# Patient Record
Sex: Female | Born: 1960 | Race: White | Hispanic: No | Marital: Married | Smoking: Never smoker
Health system: Southern US, Community
[De-identification: ages and names within clinical notes are randomized; demographics above are authoritative.]

## PROBLEM LIST (undated history)

## (undated) DIAGNOSIS — Z8619 Personal history of other infectious and parasitic diseases: Secondary | ICD-10-CM

## (undated) DIAGNOSIS — J309 Allergic rhinitis, unspecified: Secondary | ICD-10-CM

## (undated) DIAGNOSIS — E039 Hypothyroidism, unspecified: Secondary | ICD-10-CM

## (undated) DIAGNOSIS — K219 Gastro-esophageal reflux disease without esophagitis: Secondary | ICD-10-CM

## (undated) DIAGNOSIS — E079 Disorder of thyroid, unspecified: Secondary | ICD-10-CM

## (undated) DIAGNOSIS — S93409A Sprain of unspecified ligament of unspecified ankle, initial encounter: Secondary | ICD-10-CM

## (undated) DIAGNOSIS — M1990 Unspecified osteoarthritis, unspecified site: Secondary | ICD-10-CM

## (undated) DIAGNOSIS — Z9289 Personal history of other medical treatment: Secondary | ICD-10-CM

## (undated) HISTORY — PX: KNEE ARTHROSCOPY: SUR90

## (undated) HISTORY — DX: Personal history of other infectious and parasitic diseases: Z86.19

## (undated) HISTORY — DX: Personal history of other medical treatment: Z92.89

## (undated) HISTORY — PX: OOPHORECTOMY: SHX86

## (undated) HISTORY — DX: Sprain of unspecified ligament of unspecified ankle, initial encounter: S93.409A

## (undated) HISTORY — DX: Allergic rhinitis, unspecified: J30.9

## (undated) HISTORY — DX: Disorder of thyroid, unspecified: E07.9

---

## 1992-07-17 HISTORY — PX: DILATION AND CURETTAGE OF UTERUS: SHX78

## 1994-05-20 HISTORY — PX: LASIK: SHX215

## 1997-06-10 ENCOUNTER — Ambulatory Visit (HOSPITAL_COMMUNITY): Admission: RE | Admit: 1997-06-10 | Discharge: 1997-06-10 | Payer: Self-pay

## 2001-03-07 HISTORY — PX: ABDOMINAL HYSTERECTOMY: SHX81

## 2005-12-13 ENCOUNTER — Emergency Department: Payer: Self-pay | Admitting: Emergency Medicine

## 2005-12-13 ENCOUNTER — Other Ambulatory Visit: Payer: Self-pay

## 2010-08-04 DIAGNOSIS — E039 Hypothyroidism, unspecified: Secondary | ICD-10-CM | POA: Insufficient documentation

## 2010-08-20 LAB — HM MAMMOGRAPHY

## 2013-10-28 LAB — BASIC METABOLIC PANEL
BUN: 14 mg/dL (ref 4–21)
CREATININE: 1 mg/dL (ref 0.5–1.1)
Glucose: 90 mg/dL
POTASSIUM: 4.2 mmol/L (ref 3.4–5.3)
Sodium: 139 mmol/L (ref 137–147)

## 2013-10-28 LAB — CBC AND DIFFERENTIAL
HCT: 42 % (ref 36–46)
Hemoglobin: 15.1 g/dL (ref 12.0–16.0)
PLATELETS: 224 10*3/uL (ref 150–399)
WBC: 5.7 10^3/mL

## 2013-10-28 LAB — HEPATIC FUNCTION PANEL
ALT: 18 U/L (ref 7–35)
AST: 26 U/L (ref 13–35)

## 2014-04-28 LAB — TSH: TSH: 0.66 u[IU]/mL (ref <=5.90)

## 2014-08-29 ENCOUNTER — Encounter: Payer: Self-pay | Admitting: Family Medicine

## 2014-08-29 LAB — HM COLONOSCOPY

## 2014-10-30 ENCOUNTER — Other Ambulatory Visit: Payer: Self-pay | Admitting: Family Medicine

## 2014-12-23 ENCOUNTER — Other Ambulatory Visit: Payer: Self-pay | Admitting: Family Medicine

## 2015-01-07 DIAGNOSIS — L219 Seborrheic dermatitis, unspecified: Secondary | ICD-10-CM | POA: Insufficient documentation

## 2015-01-07 DIAGNOSIS — R232 Flushing: Secondary | ICD-10-CM | POA: Insufficient documentation

## 2015-01-07 DIAGNOSIS — R609 Edema, unspecified: Secondary | ICD-10-CM | POA: Insufficient documentation

## 2015-01-07 DIAGNOSIS — M7740 Metatarsalgia, unspecified foot: Secondary | ICD-10-CM | POA: Insufficient documentation

## 2015-01-07 DIAGNOSIS — M2140 Flat foot [pes planus] (acquired), unspecified foot: Secondary | ICD-10-CM | POA: Insufficient documentation

## 2015-01-07 DIAGNOSIS — R591 Generalized enlarged lymph nodes: Secondary | ICD-10-CM | POA: Insufficient documentation

## 2015-01-08 ENCOUNTER — Ambulatory Visit (INDEPENDENT_AMBULATORY_CARE_PROVIDER_SITE_OTHER): Payer: BLUE CROSS/BLUE SHIELD | Admitting: Family Medicine

## 2015-01-08 ENCOUNTER — Encounter: Payer: Self-pay | Admitting: Family Medicine

## 2015-01-08 VITALS — BP 92/64 | HR 68 | Temp 98.7°F | Resp 16 | Ht 63.16 in | Wt 145.0 lb

## 2015-01-08 DIAGNOSIS — Z1159 Encounter for screening for other viral diseases: Secondary | ICD-10-CM

## 2015-01-08 DIAGNOSIS — E039 Hypothyroidism, unspecified: Secondary | ICD-10-CM | POA: Diagnosis not present

## 2015-01-08 DIAGNOSIS — Z0000 Encounter for general adult medical examination without abnormal findings: Secondary | ICD-10-CM

## 2015-01-08 DIAGNOSIS — J309 Allergic rhinitis, unspecified: Secondary | ICD-10-CM | POA: Diagnosis not present

## 2015-01-08 NOTE — Progress Notes (Signed)
Patient: Lisa Skinner, Female    DOB: Nov 04, 1960, 54 y.o.   MRN: [id number] Visit Date: 01/08/2015  Today's Provider: Lelon Huh, MD   Chief Complaint  Patient presents with  . Annual Exam  . Hypothyroidism   Subjective:    Annual physical exam Lisa Skinner is a 54 y.o. female who presents today for health maintenance and complete physical. She feels well. She reports exercising yes/walking. She reports she is sleeping well/7 hours.  -----------------------------------------------------------------   Follow-up for hypothyroidism from 04/28/2014; Lab Results  Component Value Date   TSH 0.66 04/28/2014   She states she been more irritable lately with some hot flashes similar to what she had when she was initially diagnosed with hypothyroidism. She recently had yearly check up with Ammie Dalton and states she was told she should have thyroid functions checked.    Allergies She states combination of Flonase and fexofenadine remains fairly effective.   Review of Systems  Constitutional:       Irritability and hot flashes   HENT: Positive for congestion, postnasal drip, rhinorrhea, sinus pressure and sneezing.   Eyes: Positive for itching.  Respiratory: Positive for cough and wheezing.   Cardiovascular: Negative.   Gastrointestinal: Positive for abdominal distention.  Endocrine: Positive for cold intolerance, polydipsia and polyuria.  Genitourinary: Positive for urgency and frequency.  Musculoskeletal: Positive for myalgias, back pain, arthralgias and neck stiffness.  Skin: Negative.   Allergic/Immunologic: Positive for environmental allergies and food allergies.  Neurological: Positive for light-headedness.  Hematological: Negative.   Psychiatric/Behavioral: Positive for agitation. The patient is nervous/anxious.     Social History She  reports that she has never smoked. She does not have any smokeless tobacco history on file. She reports that she does not drink  alcohol or use illicit drugs. Social History   Social History  . Marital Status: Married    Spouse Name: N/A  . Number of Children: 0  . Years of Education: N/A   Occupational History  . 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 Smoking status: Never Smoker   . Smokeless tobacco: None  . Alcohol Use: No  . Drug Use: No  . Sexual Activity: Not Asked   Other Topics Concern  . None   Social History Narrative    Patient Active Problem List   Diagnosis Date Noted  . Seborrheic dermatitis 01/07/2015  . Edema 01/07/2015  . Hot flashes 01/07/2015  . Pes planus 01/07/2015  . Hypothyroidism 08/04/2010  . Allergic rhinitis 06/23/2008  . Displacement of lumbar intervertebral disc without myelopathy 02/17/2006  . H/O total hysterectomy 03/07/2001    Past Surgical History  Procedure Laterality Date  . Abdominal hysterectomy  2003    with BSO multiple cyst    Family History  Family Status  Relation Status Death Age  . Mother Deceased 63  . Father Alive     Borderline Diabetes   Her family history includes Alcohol abuse in her father; Cancer in her mother.    Allergies  Allergen Reactions  . Augmentin  [Amoxicillin-Pot Clavulanate]     (GI upset)  . Codeine     Other reaction(s): Abdominal pain    Previous Medications   ESTRADIOL (VIVELLE-DOT) 0.05 MG/24HR PATCH    Place 1 patch onto the skin 2 (two) times a week.   FEXOFENADINE (ALLEGRA) 180 MG TABLET    Take 1 tablet by mouth daily.   FLUTICASONE (FLONASE) 50 MCG/ACT  NASAL SPRAY    instill 2 sprays into each nostril once daily   LEVOTHYROXINE (SYNTHROID, LEVOTHROID) 88 MCG TABLET    take 1 tablet by mouth every morning ON AN EMPTY STOMACH   MULTIPLE VITAMINS-MINERALS (MULTIVITAMIN ADULTS 50+) TABS    Take 1 tablet by mouth daily.   OMEGA-3 FATTY ACIDS (FISH OIL) 1360 MG CAPS    Take 1 capsule by mouth daily.   PROBIOTIC CAPS    Take 1 capsule by mouth daily.    Patient Care Team: Birdie Sons, MD as PCP - General (Family Medicine) Eusebio Me, MD (Unknown Physician Specialty)     Objective:   Vitals: BP 92/64 mmHg  Pulse 68  Temp(Src) 98.7 F (37.1 C) (Oral)  Resp 16  Ht 5' 3.16" (1.604 m)  Wt 145 lb (65.772 kg)  BMI 25.56 kg/m2  SpO2 100%   Physical Exam   General Appearance:    Alert, cooperative, no distress, appears stated age  Head:    Normocephalic, without obvious abnormality, atraumatic  Eyes:    PERRL, conjunctiva/corneas clear, EOM's intact, fundi    benign, both eyes  Ears:    Normal TM's and external ear canals, both ears  Nose:   Nares normal, septum midline, mucosa normal, no drainage    or sinus tenderness  Throat:   Lips, mucosa, and tongue normal; teeth and gums normal  Neck:   Supple, symmetrical, trachea midline, no adenopathy;    thyroid:  no enlargement/tenderness/nodules; no carotid   bruit or JVD  Back:     Symmetric, no curvature, ROM normal, no CVA tenderness  Lungs:     Clear to auscultation bilaterally, respirations unlabored  Chest Wall:    No tenderness or deformity   Heart:    Regular rate and rhythm, S1 and S2 normal, no murmur, rub   or gallop  Breast Exam:    deferred  Abdomen:     Soft, non-tender, bowel sounds active all four quadrants,    no masses, no organomegaly  Pelvic:    deferred  Extremities:   Extremities normal, atraumatic, no cyanosis or edema  Pulses:   2+ and symmetric all extremities  Skin:   Skin color, texture, turgor normal, no rashes or lesions  Lymph nodes:   Cervical, supraclavicular, and axillary nodes normal  Neurologic:   CNII-XII intact, normal strength, sensation and reflexes    throughout     Depression Screen PHQ 2/9 Scores 01/08/2015  PHQ - 2 Score 0  PHQ- 9 Score 0      Assessment & Plan:     Routine Health Maintenance and Physical Exam  Exercise Activities and Dietary recommendations Goals    None      Immunization History  Administered Date(s) Administered  .  Influenza-Unspecified 12/09/2014  . Tdap 05/15/2012    Health Maintenance  Topic Date Due  . Hepatitis C Screening  12-Jan-1961  . MAMMOGRAM  Ammie Dalton  . INFLUENZA VACCINE  Received at work  . TETANUS/TDAP  05/16/2022  . COLONOSCOPY  08/28/2024      Discussed health benefits of physical activity, and encouraged her to engage in regular exercise appropriate for her age and condition.    --------------------------------------------------------------------  1. Annual physical exam  - Lipid panel - Comprehensive metabolic panel  2. Hypothyroidism, unspecified hypothyroidism type  - T4, free - TSH  3. Allergic rhinitis, unspecified allergic rhinitis type Doing well on fexofenadine and fluticasone nasal spray  4. Need for hepatitis  C screening test  - Hepatitis C antibody

## 2015-01-09 ENCOUNTER — Telehealth: Payer: Self-pay | Admitting: Family Medicine

## 2015-01-09 LAB — COMPREHENSIVE METABOLIC PANEL
A/G RATIO: 1.6 (ref 1.1–2.5)
ALBUMIN: 4.2 g/dL (ref 3.5–5.5)
ALT: 21 IU/L (ref 0–32)
AST: 29 IU/L (ref 0–40)
Alkaline Phosphatase: 51 IU/L (ref 39–117)
BUN/Creatinine Ratio: 13 (ref 9–23)
BUN: 12 mg/dL (ref 6–24)
Bilirubin Total: 1 mg/dL (ref 0.0–1.2)
CALCIUM: 9.4 mg/dL (ref 8.7–10.2)
CO2: 26 mmol/L (ref 18–29)
Chloride: 100 mmol/L (ref 97–106)
Creatinine, Ser: 0.94 mg/dL (ref 0.57–1.00)
GFR, EST AFRICAN AMERICAN: 80 mL/min/{1.73_m2} (ref >59)
GFR, EST NON AFRICAN AMERICAN: 69 mL/min/{1.73_m2} (ref >59)
GLOBULIN, TOTAL: 2.7 g/dL (ref 1.5–4.5)
Glucose: 92 mg/dL (ref 65–99)
POTASSIUM: 4.8 mmol/L (ref 3.5–5.2)
Sodium: 140 mmol/L (ref 136–144)
TOTAL PROTEIN: 6.9 g/dL (ref 6.0–8.5)

## 2015-01-09 LAB — LIPID PANEL
CHOLESTEROL TOTAL: 199 mg/dL (ref 100–199)
Chol/HDL Ratio: 2.1 ratio units (ref 0.0–4.4)
HDL: 94 mg/dL (ref >39)
LDL Calculated: 93 mg/dL (ref 0–99)
TRIGLYCERIDES: 62 mg/dL (ref 0–149)
VLDL Cholesterol Cal: 12 mg/dL (ref 5–40)

## 2015-01-09 LAB — TSH: TSH: 0.897 u[IU]/mL (ref 0.450–4.500)

## 2015-01-09 LAB — HEPATITIS C ANTIBODY

## 2015-01-09 LAB — T4, FREE: Free T4: 1.73 ng/dL (ref 0.82–1.77)

## 2015-01-09 MED ORDER — LEVOTHYROXINE SODIUM 75 MCG PO TABS: 75.0000 ug | ORAL_TABLET | Freq: Every day | ORAL | Status: DC

## 2015-01-09 NOTE — Telephone Encounter (Signed)
LMOVM to return call.

## 2015-01-09 NOTE — Telephone Encounter (Signed)
-----   Message from Birdie Sons, MD sent at 01/09/2015  7:58 AM EDT ----- Cholesterol is very good at 199. Blood sugar, electrolytes, and kidney functions are normal. Thyroid hormone is at the high end of the normal range. Too much thyroid hormone might cause hot flashes. If she likes we can try reducing levothyroxine to 69mcg to see if this helps.

## 2015-01-09 NOTE — Telephone Encounter (Signed)
rx was send to [facility]. Have patient call at the end of the month and let me know if she feels better with the 75 or th 52mcg dose.

## 2015-01-09 NOTE — Telephone Encounter (Addendum)
Patient notified of results. Patient expressed understanding. Rx pending for quantity.

## 2015-01-09 NOTE — Telephone Encounter (Signed)
Pt returning call.  CB#[phone]/MW

## 2015-01-12 NOTE — Telephone Encounter (Signed)
Patient notified. Patient stated that she can already tell a difference since starting the 75 mcg. Patient said that she has not been having the hot flashes. Patient will call in a month and let us know how 75 mcg is working.

## 2015-02-05 ENCOUNTER — Telehealth: Payer: Self-pay | Admitting: Family Medicine

## 2015-02-05 NOTE — Telephone Encounter (Signed)
Pt wanted to up date the nurses on how she has been doing on taking the lower dose of levothyroxine (SYNTHROID, LEVOTHROID) 75 MCG tablet. Pt stated so far she has been doing well with the dosage change. Thanks TNP

## 2015-03-05 ENCOUNTER — Other Ambulatory Visit: Payer: Self-pay | Admitting: Family Medicine

## 2015-05-29 ENCOUNTER — Ambulatory Visit: Payer: BLUE CROSS/BLUE SHIELD | Admitting: Family Medicine

## 2015-05-29 ENCOUNTER — Encounter: Payer: Self-pay | Admitting: Family Medicine

## 2015-05-29 ENCOUNTER — Ambulatory Visit (INDEPENDENT_AMBULATORY_CARE_PROVIDER_SITE_OTHER): Payer: BC Managed Care – PPO | Admitting: Family Medicine

## 2015-05-29 VITALS — BP 104/60 | HR 67 | Temp 98.1°F | Resp 16 | Ht 63.16 in | Wt 143.0 lb

## 2015-05-29 DIAGNOSIS — R52 Pain, unspecified: Secondary | ICD-10-CM | POA: Diagnosis not present

## 2015-05-29 DIAGNOSIS — E039 Hypothyroidism, unspecified: Secondary | ICD-10-CM | POA: Diagnosis not present

## 2015-05-29 DIAGNOSIS — J069 Acute upper respiratory infection, unspecified: Secondary | ICD-10-CM | POA: Diagnosis not present

## 2015-05-29 LAB — POCT INFLUENZA A/B
Influenza A, POC: NEGATIVE
Influenza B, POC: NEGATIVE

## 2015-05-29 MED ORDER — LEVOTHYROXINE SODIUM 88 MCG PO TABS: 88.0000 ug | ORAL_TABLET | Freq: Every day | ORAL | Status: DC

## 2015-05-29 MED ORDER — AMOXICILLIN 500 MG PO CAPS: 1000.0000 mg | ORAL_CAPSULE | Freq: Two times a day (BID) | ORAL | Status: AC

## 2015-05-29 NOTE — Progress Notes (Signed)
Patient: Lisa Skinner Female    DOB: April 23, 1960   55 y.o.   MRN: [id number] Visit Date: 05/29/2015  Today's Provider: Lelon Huh, MD   Chief Complaint  Patient presents with  . Cough   Subjective:    Cough This is a new problem. Episode onset: 2 days. The problem has been gradually worsening. The cough is non-productive. Associated symptoms include chest pain, a fever, headaches, nasal congestion, postnasal drip, rhinorrhea and a sore throat. Pertinent negatives include no chills, ear congestion, ear pain, rash, shortness of breath or sweats. Associated symptoms comments: Itchy ears. The symptoms are aggravated by fumes, exercise and cold air. Treatments tried: sudafed, tylenol  The treatment provided mild relief. Her past medical history is significant for environmental allergies.    Sinus congestion started 2 days ago. Nasal drainage, facial pressure, non-productive cough, scratchy throat and shoulder pain. Took OTC pain reliever last night.   Hypothyroid Since last visit we reduced levothyroxine, but she states after a few months she felt so tired she had to go back up to previous dose of 54mcg and felt energy level was much better. She has not had labs checked since then Lab Results  Component Value Date   TSH 0.897 01/08/2015     Allergies  Allergen Reactions  . Augmentin  [Amoxicillin-Pot Clavulanate]     (GI upset)  . Codeine     Other reaction(s): Abdominal pain   Previous Medications   ESTRADIOL (VIVELLE-DOT) 0.05 MG/24HR PATCH    Place 1 patch onto the skin 2 (two) times a week.   FEXOFENADINE (ALLEGRA) 180 MG TABLET    Take 1 tablet by mouth daily.   FLUTICASONE (FLONASE) 50 MCG/ACT NASAL SPRAY    instill 2 sprays into each nostril once daily   LEVOTHYROXINE (SYNTHROID, LEVOTHROID) 88 MCG TABLET    take 1 tablet by mouth once daily before BREAKFAST   MULTIPLE VITAMINS-MINERALS (MULTIVITAMIN ADULTS 50+) TABS    Take 1 tablet by mouth daily.   OMEGA-3 FATTY  ACIDS (FISH OIL) 1360 MG CAPS    Take 1 capsule by mouth daily.   PROBIOTIC CAPS    Take 1 capsule by mouth daily.    Review of Systems  Constitutional: Positive for fever. Negative for chills, appetite change and fatigue.  HENT: Positive for congestion, postnasal drip, rhinorrhea, sinus pressure and sore throat. Negative for ear pain.   Respiratory: Positive for cough. Negative for chest tightness and shortness of breath.   Cardiovascular: Positive for chest pain. Negative for palpitations.  Gastrointestinal: Negative for nausea, vomiting and abdominal pain.  Musculoskeletal:       Shoulder pain  Skin: Negative for rash.  Allergic/Immunologic: Positive for environmental allergies.  Neurological: Positive for headaches. Negative for dizziness and weakness.    Social History  Substance Use Topics  . Smoking status: Never Smoker   . Smokeless tobacco: Not on file  . Alcohol Use: No   Objective:   BP 104/60 mmHg  Pulse 67  Temp(Src) 98.1 F (36.7 C) (Oral)  Resp 16  Ht 5' 3.16" (1.604 m)  Wt 143 lb (64.864 kg)  BMI 25.21 kg/m2  SpO2 97%  Physical Exam  General Appearance:    Alert, cooperative, no distress  HENT:   bilateral TM normal without fluid or infection, neck without nodes, neck has bilateral anterior cervical nodes enlarged, throat normal without erythema or exudate, frontal and maxillary sinus tender, nasal mucosa congested and nasal mucosa pale and congested  Eyes:    PERRL, conjunctiva/corneas clear, EOM's intact       Lungs:     Clear to auscultation bilaterally, respirations unlabored  Heart:    Regular rate and rhythm  Neurologic:   Awake, alert, oriented x 3. No apparent focal neurological           defect.       Results for orders placed or performed in visit on 05/29/15  POCT Influenza A/B  Result Value Ref Range   Influenza A, POC Negative Negative   Influenza B, POC Negative Negative      Assessment & Plan:     1. Upper respiratory  infection Counseled regarding signs and symptoms of viral and bacterial respiratory infections. Advised to call or return for additional evaluation if he develops any sign of bacterial infection, or if current symptoms last longer than 10 days.    - amoxicillin (AMOXIL) 500 MG capsule; Take 2 capsules (1,000 mg total) by mouth 2 (two) times daily.  Dispense: 40 capsule; Refill: 0  2. Body aches  - POCT Influenza A/B  3. Hypothyroidism, unspecified hypothyroidism type Feels better since going back up to 69mcg levothyroxine.  - T4 AND TSH       Lelon Huh, MD  [facility]

## 2015-06-02 ENCOUNTER — Telehealth: Payer: Self-pay

## 2015-06-02 ENCOUNTER — Telehealth: Payer: Self-pay | Admitting: Family Medicine

## 2015-06-02 LAB — T4 AND TSH
T4, Total: 11.2 ug/dL (ref 4.5–12.0)
TSH: 1.01 u[IU]/mL (ref 0.450–4.500)

## 2015-06-02 MED ORDER — LEVOTHYROXINE SODIUM 88 MCG PO TABS: 88.0000 ug | ORAL_TABLET | Freq: Every day | ORAL | Status: DC

## 2015-06-02 NOTE — Telephone Encounter (Signed)
Informed pt. Lisa Skinner, CMA  

## 2015-06-02 NOTE — Telephone Encounter (Signed)
Have sent refill for 77mcg tablets to [facility]

## 2015-06-02 NOTE — Telephone Encounter (Signed)
Advised pt of lab results. Pt is questioning if she needs to be on Levothyroxine 88 mcg vs 75 mcg. Is feeling well on the 88 mcg. If you want pt to stay on this dose, she needs a new rx to be sent to [facility]. Thanks. Renaldo Fiddler, CMA

## 2015-08-13 ENCOUNTER — Other Ambulatory Visit: Payer: Self-pay | Admitting: Family Medicine

## 2015-08-13 DIAGNOSIS — M5126 Other intervertebral disc displacement, lumbar region: Secondary | ICD-10-CM

## 2015-08-14 LAB — HM PAP SMEAR: HM PAP: NEGATIVE

## 2015-09-15 DIAGNOSIS — Z9289 Personal history of other medical treatment: Secondary | ICD-10-CM

## 2015-09-15 HISTORY — DX: Personal history of other medical treatment: Z92.89

## 2015-09-15 LAB — HM MAMMOGRAPHY

## 2015-10-14 NOTE — Telephone Encounter (Signed)
error 

## 2015-11-11 ENCOUNTER — Ambulatory Visit (INDEPENDENT_AMBULATORY_CARE_PROVIDER_SITE_OTHER): Payer: BC Managed Care – PPO | Admitting: Family Medicine

## 2015-11-11 ENCOUNTER — Encounter: Payer: Self-pay | Admitting: Family Medicine

## 2015-11-11 VITALS — BP 108/70 | HR 64 | Temp 98.1°F | Resp 16 | Wt 143.0 lb

## 2015-11-11 DIAGNOSIS — J069 Acute upper respiratory infection, unspecified: Secondary | ICD-10-CM

## 2015-11-11 MED ORDER — AMOXICILLIN 500 MG PO CAPS: 1000.0000 mg | ORAL_CAPSULE | Freq: Two times a day (BID) | ORAL | 0 refills | Status: AC

## 2015-11-11 NOTE — Progress Notes (Signed)
Patient: Lisa Skinner Female    DOB: 01/11/1961   55 y.o.   MRN: [id number] Visit Date: 11/11/2015  Today's Provider: Lelon Huh, MD   Chief Complaint  Patient presents with  . Ear Pain   Subjective:    HPI Ear Pain: Patient comes in reporting she has had pain in her right ear for 3 days. Patient also has soreness on the right side of her neck and throat. Patient has tried taking Tylenol to help relieve pain. Ear has worsened since onset. Ear pain is described as a throbbing sensation.     Allergies  Allergen Reactions  . Augmentin  [Amoxicillin-Pot Clavulanate]     (GI upset)  . Codeine     Other reaction(s): Abdominal pain     Current Outpatient Prescriptions:  .  estradiol (VIVELLE-DOT) 0.05 MG/24HR patch, Place 1 patch onto the skin 2 (two) times a week., Disp: , Rfl:  .  etodolac (LODINE) 400 MG tablet, take 1 tablet by mouth twice a day if needed, Disp: 30 tablet, Rfl: 5 .  fexofenadine (ALLEGRA) 180 MG tablet, Take 1 tablet by mouth daily., Disp: , Rfl:  .  fluticasone (FLONASE) 50 MCG/ACT nasal spray, instill 2 sprays into each nostril once daily, Disp: 16 g, Rfl: 6 .  levothyroxine (SYNTHROID, LEVOTHROID) 88 MCG tablet, Take 1 tablet (88 mcg total) by mouth daily., Disp: 90 tablet, Rfl: 3 .  Multiple Vitamins-Minerals (MULTIVITAMIN ADULTS 50+) TABS, Take 1 tablet by mouth daily., Disp: , Rfl:  .  Omega-3 Fatty Acids (FISH OIL) 1360 MG CAPS, Take 1 capsule by mouth daily., Disp: , Rfl:  .  Probiotic CAPS, Take 1 capsule by mouth daily., Disp: , Rfl:   Review of Systems  Constitutional: Negative for appetite change, chills, fatigue and fever.  HENT: Positive for congestion, ear pain and sore throat.   Respiratory: Positive for cough. Negative for chest tightness and shortness of breath.   Cardiovascular: Negative for chest pain and palpitations.  Gastrointestinal: Negative for abdominal pain, nausea and vomiting.  Musculoskeletal: Positive for neck pain.    Neurological: Negative for dizziness and weakness.    Social History  Substance Use Topics  . Smoking status: Never Smoker  . Smokeless tobacco: Never Used  . Alcohol use No   Objective:   BP 108/70 (BP Location: Right Arm, Patient Position: Sitting, Cuff Size: Normal)   Pulse 64   Temp 98.1 F (36.7 C) (Oral)   Resp 16   Wt 143 lb (64.9 kg)   BMI 25.20 kg/m   Physical Exam  General Appearance:    Alert, cooperative, no distress  HENT:   bilateral TM normal without fluid or infection, neck has right anterior cervical nodes enlarged, pharynx erythematous without exudate, sinuses nontender, post nasal drip noted and nasal mucosa pale and congested  Eyes:    PERRL, conjunctiva/corneas clear, EOM's intact       Lungs:     Clear to auscultation bilaterally, respirations unlabored  Heart:    Regular rate and rhythm  Neurologic:   Awake, alert, oriented x 3. No apparent focal neurological           defect.           Assessment & Plan:     1. Upper respiratory infection Counseled regarding signs and symptoms of viral and bacterial respiratory infections. Advised to fill antibiotic prescription if she develops any sign of bacterial infection, or if current symptoms last longer than  10 days.    - amoxicillin (AMOXIL) 500 MG capsule; Take 2 capsules (1,000 mg total) by mouth 2 (two) times daily.  Dispense: 40 capsule; Refill: 0       Lelon Huh, MD  [facility]

## 2016-02-26 ENCOUNTER — Encounter: Payer: Self-pay | Admitting: Family Medicine

## 2016-02-26 ENCOUNTER — Ambulatory Visit (INDEPENDENT_AMBULATORY_CARE_PROVIDER_SITE_OTHER): Payer: BC Managed Care – PPO | Admitting: Family Medicine

## 2016-02-26 VITALS — BP 132/72 | HR 63 | Temp 98.5°F | Resp 16 | Wt 148.0 lb

## 2016-02-26 DIAGNOSIS — F430 Acute stress reaction: Secondary | ICD-10-CM

## 2016-02-26 DIAGNOSIS — K130 Diseases of lips: Secondary | ICD-10-CM | POA: Diagnosis not present

## 2016-02-26 DIAGNOSIS — E039 Hypothyroidism, unspecified: Secondary | ICD-10-CM

## 2016-02-26 NOTE — Progress Notes (Signed)
Patient: Lisa Skinner Female    DOB: [birth year]   55 y.o.   MRN: [id number] Visit Date: 02/26/2016  Today's Provider: Lelon Huh, MD   Chief Complaint  Patient presents with  . Hypothyroidism    follow up  . Stress   Subjective:    HPI Follow up Hypothyroidism:  Patient was last seen 9 months ago and no changes were made. Patient reports good compliance with treatment and good tolerance.   Stress:  Patient comes in today stating that she has been more stressed in the past couple of months since she moved to [place] and having long commute to work.  She has been monitoring her heart rate on her Fitbit and has noticed that her heart rate is elevated sometimes into the 120s even at rest. . Patient has also felt some palpitations. She will be changing jobs at [employer] next month and will have much shorter commute, which she thinks will help with her stress level.  Wt Readings from Last 3 Encounters:  02/26/16 148 lb (67.1 kg)  11/11/15 143 lb (64.9 kg)  05/29/15 143 lb (64.9 kg)    She has also notice a discoloration on the left side of her bottom lip for several months. It is not sore or painful, but seems to be more noticeable.     Allergies  Allergen Reactions  . Augmentin  [Amoxicillin-Pot Clavulanate]     (GI upset)  . Codeine     Other reaction(s): Abdominal pain     Current Outpatient Prescriptions:  .  estradiol (VIVELLE-DOT) 0.05 MG/24HR patch, Place 1 patch onto the skin 2 (two) times a week., Disp: , Rfl:  .  etodolac (LODINE) 400 MG tablet, take 1 tablet by mouth twice a day if needed, Disp: 30 tablet, Rfl: 5 .  fexofenadine (ALLEGRA) 180 MG tablet, Take 1 tablet by mouth daily., Disp: , Rfl:  .  fluticasone (FLONASE) 50 MCG/ACT nasal spray, instill 2 sprays into each nostril once daily, Disp: 16 g, Rfl: 6 .  levothyroxine (SYNTHROID, LEVOTHROID) 88 MCG tablet, Take 1 tablet (88 mcg total) by mouth daily., Disp: 90 tablet, Rfl: 3 .  Multiple  Vitamins-Minerals (MULTIVITAMIN ADULTS 50+) TABS, Take 1 tablet by mouth daily., Disp: , Rfl:  .  Omega-3 Fatty Acids (FISH OIL) 1360 MG CAPS, Take 1 capsule by mouth daily., Disp: , Rfl:  .  Probiotic CAPS, Take 1 capsule by mouth daily., Disp: , Rfl:   Review of Systems  Constitutional: Positive for fatigue. Negative for appetite change, chills and fever.  Respiratory: Negative for chest tightness and shortness of breath.   Cardiovascular: Positive for palpitations. Negative for chest pain.  Gastrointestinal: Negative for abdominal pain, nausea and vomiting.  Neurological: Negative for dizziness and weakness.  Psychiatric/Behavioral: Positive for sleep disturbance. The patient is nervous/anxious.     Social History  Substance Use Topics  . Smoking status: Never Smoker  . Smokeless tobacco: Never Used  . Alcohol use No   Objective:   BP 132/72 (BP Location: Left Arm, Patient Position: Sitting, Cuff Size: Normal)   Pulse 63   Temp 98.5 F (36.9 C) (Oral)   Resp 16   Wt 148 lb (67.1 kg)   SpO2 99% Comment: room air  BMI 26.08 kg/m   Physical Exam  General appearance: alert, well developed, well nourished, cooperative and in no distress Head: Normocephalic, without obvious abnormality, atraumatic Respiratory: Respirations even and unlabored, normal respiratory rate Extremities: No gross  deformities ENT: About 47mm irregular lighl brown macular discoloration of left side of lower lip.      Assessment & Plan:     1. Hypothyroidism, unspecified type  - T4 AND TSH  2. Acute reaction to situational stress Expect this to improve when her commute shortens after started new job in January. Discussed option of prn beta blockers or anxiolytics. Will wait and see how things go next month.   3. Lip lesion  - Ambulatory referral to ENT       Lelon Huh, MD  [facility]

## 2016-02-27 LAB — T4 AND TSH
T4 TOTAL: 8 ug/dL (ref 4.5–12.0)
TSH: 1.28 u[IU]/mL (ref 0.450–4.500)

## 2016-03-02 ENCOUNTER — Telehealth: Payer: Self-pay

## 2016-03-02 NOTE — Telephone Encounter (Signed)
These are all symptoms of a viral upper respiratory infection. Treatment is OTC analgesics such as ibuprofen or tylenol, antihistamines such as OTC Zyrtec or Claritin, and OTC Mucinex DM for cough. If she develops any fever over 101, shortness of breath, chest pains severe sinus pain or headaches, or symptoms that last more than 10 days she will need an office visit.

## 2016-03-02 NOTE — Telephone Encounter (Signed)
Patient called and states she was here on Friday 12/22 for follow up and is not sure if she picked up something here but over the weekend started feeling bad. Current symptoms are swollen lymph nodes in her neck, ear ache, post nasal drainage, sneezing, slight cough, feel achy, tender above her eyes area. No fever, no body aches. I advised patient we don't usually treat over the phone. She is going back to work tomorrow and is trying not having to come in and pay another $40. Please let patient know. Thank you. CB [phone]

## 2016-03-02 NOTE — Telephone Encounter (Signed)
Patient advised as below. Patient verbalizes understanding and is in agreement with treatment plan.  

## 2016-03-08 ENCOUNTER — Encounter: Payer: Self-pay | Admitting: Family Medicine

## 2016-03-10 ENCOUNTER — Encounter: Payer: Self-pay | Admitting: Family Medicine

## 2016-03-10 ENCOUNTER — Encounter: Payer: Self-pay | Admitting: Physician Assistant

## 2016-03-10 ENCOUNTER — Ambulatory Visit (INDEPENDENT_AMBULATORY_CARE_PROVIDER_SITE_OTHER): Payer: BC Managed Care – PPO | Admitting: Family Medicine

## 2016-03-10 VITALS — BP 110/70 | HR 66 | Temp 97.9°F | Resp 16 | Wt 151.0 lb

## 2016-03-10 DIAGNOSIS — J329 Chronic sinusitis, unspecified: Secondary | ICD-10-CM | POA: Diagnosis not present

## 2016-03-10 MED ORDER — AMOXICILLIN 500 MG PO CAPS: 1000.0000 mg | ORAL_CAPSULE | Freq: Two times a day (BID) | ORAL | 0 refills | Status: AC

## 2016-03-10 NOTE — Progress Notes (Signed)
Patient: Lisa Skinner Female    DOB: 08-Feb-1961   56 y.o.   MRN: [id number] Visit Date: 03/10/2016  Today's Provider: Lelon Huh, MD   Chief Complaint  Patient presents with  . Sinusitis   Subjective:    Patient states that she has had sinus pressure and pain for about 10 days. Symptoms of sinus pressure, pain, ear congestion, dry cough, headaches and neck pain. Mild fever. Patient has taken otc tylenol and a sudafed with mild relief.    Sinusitis  This is a new problem. The current episode started 1 to 4 weeks ago (10 days). The problem has been gradually worsening since onset. The maximum temperature recorded prior to her arrival was 100.4 - 100.9 F. Associated symptoms include congestion, coughing, ear pain, headaches, neck pain, sinus pressure and sneezing. Pertinent negatives include no chills, diaphoresis, hoarse voice, shortness of breath, sore throat or swollen glands. Past treatments include acetaminophen (tylenol, cold medication). The treatment provided mild relief.       Allergies  Allergen Reactions  . Augmentin  [Amoxicillin-Pot Clavulanate]     (GI upset)  . Codeine     Other reaction(s): Abdominal pain     Current Outpatient Prescriptions:  .  estradiol (VIVELLE-DOT) 0.05 MG/24HR patch, Place 1 patch onto the skin 2 (two) times a week., Disp: , Rfl:  .  etodolac (LODINE) 400 MG tablet, take 1 tablet by mouth twice a day if needed, Disp: 30 tablet, Rfl: 5 .  fexofenadine (ALLEGRA) 180 MG tablet, Take 1 tablet by mouth daily., Disp: , Rfl:  .  fluticasone (FLONASE) 50 MCG/ACT nasal spray, instill 2 sprays into each nostril once daily, Disp: 16 g, Rfl: 6 .  levothyroxine (SYNTHROID, LEVOTHROID) 88 MCG tablet, Take 1 tablet (88 mcg total) by mouth daily., Disp: 90 tablet, Rfl: 3 .  Multiple Vitamins-Minerals (MULTIVITAMIN ADULTS 50+) TABS, Take 1 tablet by mouth daily., Disp: , Rfl:  .  Omega-3 Fatty Acids (FISH OIL) 1360 MG CAPS, Take 1 capsule by mouth  daily., Disp: , Rfl:  .  Probiotic CAPS, Take 1 capsule by mouth daily., Disp: , Rfl:   Review of Systems  Constitutional: Negative for appetite change, chills, diaphoresis, fatigue and fever.  HENT: Positive for congestion, ear pain, postnasal drip, sinus pain, sinus pressure and sneezing. Negative for hoarse voice and sore throat.   Respiratory: Positive for cough. Negative for chest tightness and shortness of breath.   Cardiovascular: Negative for chest pain and palpitations.  Gastrointestinal: Negative for abdominal pain, nausea and vomiting.  Musculoskeletal: Positive for neck pain.  Neurological: Positive for headaches. Negative for dizziness and weakness.    Social History  Substance Use Topics  . Smoking status: Never Smoker  . Smokeless tobacco: Never Used  . Alcohol use No   Objective:   BP 110/70 (BP Location: Left Arm, Patient Position: Sitting, Cuff Size: Normal)   Pulse 66   Temp 97.9 F (36.6 C) (Oral)   Resp 16   Wt 151 lb (68.5 kg)   SpO2 96%   BMI 26.61 kg/m   Physical Exam  General Appearance:    Alert, cooperative, no distress  HENT:   bilateral TM normal without fluid or infection, neck has bilateral anterior cervical nodes enlarged, pharynx erythematous without exudate, frontal sinus tender and nasal mucosa pale and congested  Eyes:    PERRL, conjunctiva/corneas clear, EOM's intact       Lungs:     Clear to auscultation  bilaterally, respirations unlabored  Heart:    Regular rate and rhythm  Neurologic:   Awake, alert, oriented x 3. No apparent focal neurological           defect.           Assessment & Plan:     1. Sinusitis, unspecified chronicity, unspecified location Is already using humidifier, oral decongestant  and fluticasone. Also recommend adding nasal saline.  - amoxicillin (AMOXIL) 500 MG capsule; Take 2 capsules (1,000 mg total) by mouth 2 (two) times daily.  Dispense: 40 capsule; Refill: 0  Call if symptoms change or if not rapidly  improving.        The entirety of the information documented in the History of Present Illness, Review of Systems and Physical Exam were personally obtained by me. Portions of this information were initially documented by Theressa Millard, CMA and reviewed by me for thoroughness and accuracy.    Lelon Huh, MD  [facility]

## 2016-05-02 ENCOUNTER — Ambulatory Visit (INDEPENDENT_AMBULATORY_CARE_PROVIDER_SITE_OTHER): Payer: BC Managed Care – PPO | Admitting: Family Medicine

## 2016-05-02 ENCOUNTER — Encounter: Payer: Self-pay | Admitting: Family Medicine

## 2016-05-02 VITALS — BP 108/68 | HR 65 | Temp 98.5°F | Resp 16 | Wt 146.2 lb

## 2016-05-02 DIAGNOSIS — R52 Pain, unspecified: Secondary | ICD-10-CM | POA: Diagnosis not present

## 2016-05-02 DIAGNOSIS — R509 Fever, unspecified: Secondary | ICD-10-CM | POA: Diagnosis not present

## 2016-05-02 DIAGNOSIS — J101 Influenza due to other identified influenza virus with other respiratory manifestations: Secondary | ICD-10-CM

## 2016-05-02 LAB — POCT INFLUENZA A/B
INFLUENZA A, POC: NEGATIVE
INFLUENZA B, POC: POSITIVE — AB

## 2016-05-02 MED ORDER — AMOXICILLIN 875 MG PO TABS: 875.0000 mg | ORAL_TABLET | Freq: Two times a day (BID) | ORAL | 0 refills | Status: DC

## 2016-05-02 MED ORDER — OSELTAMIVIR PHOSPHATE 75 MG PO CAPS: 75.0000 mg | ORAL_CAPSULE | Freq: Two times a day (BID) | ORAL | 0 refills | Status: DC

## 2016-05-02 NOTE — Patient Instructions (Signed)

## 2016-05-02 NOTE — Progress Notes (Signed)
Patient: Lisa Skinner Female    DOB: 01-Jul-1960   56 y.o.   MRN: [id number] Visit Date: 05/02/2016  Today's Provider: Vernie Murders, PA   Chief Complaint  Patient presents with  . URI   Subjective:    URI   This is a new problem. The current episode started in the past 7 days. The problem has been gradually worsening. Associated symptoms include congestion, coughing, headaches, rhinorrhea and sinus pain. Associated symptoms comments: Body aches . She has tried acetaminophen for the symptoms.   Past Medical History:  Diagnosis Date  . History of mumps   . History of shingles   . Thyroid disease    Past Surgical History:  Procedure Laterality Date  . ABDOMINAL HYSTERECTOMY  2003   with BSO multiple cyst   Family History  Problem Relation Age of Onset  . Cancer Mother     Fallopian Tube Cancer  . Alcohol abuse Father    Allergies  Allergen Reactions  . Codeine Other (See Comments)    Other reaction(s): Abdominal pain  . Allegra-D  [Fexofenadine-Pseudoephed Er]     Anxious.  . Cephalexin     GI Upset.  . Augmentin [Amoxicillin-Pot Clavulanate] Other (See Comments)    (GI upset)     Previous Medications   ESTRADIOL (VIVELLE-DOT) 0.05 MG/24HR PATCH    Place 1 patch onto the skin 2 (two) times a week.   ETODOLAC (LODINE) 400 MG TABLET    take 1 tablet by mouth twice a day if needed   FEXOFENADINE (ALLEGRA) 180 MG TABLET    Take 1 tablet by mouth daily.   FLUTICASONE (FLONASE) 50 MCG/ACT NASAL SPRAY    instill 2 sprays into each nostril once daily   LEVOTHYROXINE (SYNTHROID, LEVOTHROID) 88 MCG TABLET    Take 1 tablet (88 mcg total) by mouth daily.   MULTIPLE VITAMINS-MINERALS (MULTIVITAMIN ADULTS 50+) TABS    Take 1 tablet by mouth daily.   OMEGA-3 FATTY ACIDS (FISH OIL) 1360 MG CAPS    Take 1 capsule by mouth daily.   PROBIOTIC CAPS    Take 1 capsule by mouth daily.    Review of Systems  HENT: Positive for congestion, rhinorrhea and sinus pain.   Respiratory:  Positive for cough.   Neurological: Positive for headaches.    Social History  Substance Use Topics  . Smoking status: Never Smoker  . Smokeless tobacco: Never Used  . Alcohol use No   Objective:   BP 108/68 (BP Location: Right Arm, Patient Position: Sitting, Cuff Size: Normal)   Pulse 65   Temp 98.5 F (36.9 C) (Oral)   Resp 16   Wt 146 lb 3.2 oz (66.3 kg)   SpO2 97%   BMI 25.77 kg/m   Physical Exam  Constitutional: She is oriented to person, place, and time. She appears well-developed and well-nourished. No distress.  HENT:  Head: Normocephalic and atraumatic.  Right Ear: Hearing and external ear normal.  Left Ear: Hearing and external ear normal.  Nose: Nose normal.  Reddened posterior pharynx and nasal membranes. Slightly tender maxillary and frontal sinuses.   Eyes: Conjunctivae and lids are normal. Right eye exhibits no discharge. Left eye exhibits no discharge. No scleral icterus.  Neck: Neck supple.  Cardiovascular: Normal rate and regular rhythm.   Pulmonary/Chest: Effort normal and breath sounds normal. No respiratory distress.  Abdominal: Soft. Bowel sounds are normal.  Musculoskeletal: Normal range of motion.  Lymphadenopathy:    She has no cervical adenopathy.  Neurological: She is alert and oriented to person, place, and time.  Skin: Skin is intact. No lesion and no rash noted.  Psychiatric: She has a normal mood and affect. Her speech is normal and behavior is normal. Thought content normal.      Assessment & Plan:     1. Body aches Onset 3 days ago with rhinorrhea and sneezing. Has been using some Tylenol, Sudafed, Flonase and antihistamine. Flu test positive for influenza B. - POCT Influenza A/B  2. Fever, unspecified fever cause Spiked to 99.6 Friday. Has had a cough, slight sore throat and facial pains with headache. History of frequent sinusitis in the past. Will give Rx for Amoxil (not allergic to penicillin - has had GI upset with Augmentin) to  start if purulent nasal mucus or sputum starts.  - POCT Influenza A/B - amoxicillin (AMOXIL) 875 MG tablet; Take 1 tablet (875 mg total) by mouth 2 (two) times daily.  Dispense: 20 tablet; Refill: 0  3. Influenza B Positive flu B test. Took the flu vaccination in the Fall of 2017. Will treat with Tamiflu, May add Mucinex-DM or Delsym prn cough. Increase fluid intake and home to rest. Recommend out of work for 3-5 days to allow Tamiflu to work. Don't return to work until free of fever without taking an antipyretic. Recheck prn. - oseltamivir (TAMIFLU) 75 MG capsule; Take 1 capsule (75 mg total) by mouth 2 (two) times daily.  Dispense: 10 capsule; Refill: 0

## 2016-05-06 ENCOUNTER — Encounter: Payer: Self-pay | Admitting: Family Medicine

## 2016-05-18 ENCOUNTER — Other Ambulatory Visit: Payer: Self-pay | Admitting: Family Medicine

## 2016-07-15 ENCOUNTER — Encounter: Payer: Self-pay | Admitting: Certified Nurse Midwife

## 2016-07-15 MED ORDER — ESTRADIOL 0.05 MG/24HR TD PTTW: 1.0000 | MEDICATED_PATCH | TRANSDERMAL | 0 refills | Status: DC

## 2016-08-18 DIAGNOSIS — S93409A Sprain of unspecified ligament of unspecified ankle, initial encounter: Secondary | ICD-10-CM

## 2016-08-18 HISTORY — DX: Sprain of unspecified ligament of unspecified ankle, initial encounter: S93.409A

## 2016-08-26 ENCOUNTER — Ambulatory Visit (INDEPENDENT_AMBULATORY_CARE_PROVIDER_SITE_OTHER): Payer: BC Managed Care – PPO | Admitting: Certified Nurse Midwife

## 2016-08-26 ENCOUNTER — Encounter: Payer: Self-pay | Admitting: Certified Nurse Midwife

## 2016-08-26 ENCOUNTER — Telehealth: Payer: Self-pay

## 2016-08-26 VITALS — BP 108/68 | HR 64 | Ht 64.0 in | Wt 142.0 lb

## 2016-08-26 DIAGNOSIS — Z01419 Encounter for gynecological examination (general) (routine) without abnormal findings: Secondary | ICD-10-CM | POA: Diagnosis not present

## 2016-08-26 DIAGNOSIS — Z8262 Family history of osteoporosis: Secondary | ICD-10-CM

## 2016-08-26 DIAGNOSIS — E28319 Asymptomatic premature menopause: Secondary | ICD-10-CM

## 2016-08-26 MED ORDER — ESTRADIOL 0.05 MG/24HR TD PTTW: 1.0000 | MEDICATED_PATCH | TRANSDERMAL | 11 refills | Status: DC

## 2016-08-26 NOTE — Telephone Encounter (Signed)
Pt seen by CLG this a.m. She forgot to ask CLG to order a Bone Scan in addition to [facility]amm[facility]. ER#[phone].

## 2016-08-26 NOTE — Telephone Encounter (Signed)
Will order at [place] [facility]

## 2016-08-26 NOTE — Progress Notes (Signed)
Gynecology Annual Exam  PCP: Birdie Sons, MD  Chief Complaint:  Chief Complaint  Patient presents with  . Gynecologic Exam    History of Present Illness:Lisa Skinner presents today for her annual exam. She is a 56 year old Caucasian/White female, G0 P0000, whose LMP was 12/17/2001. She is having no significant gyn complaints. Her menses are absent due to TAH/BSO in 2003 for AUB/ pelvic pain/recurrent ovarian cysts. She currently uses the Minivelle 0.05 patch and denies hot flashes or nite sweats.  She has had no spotting.   The patient's past medical history is notable for a history of hypothyroidism, allergic rhinitis, and osteoarthritis of her knee. Dr Caryn Section is her PCP  Since her last annual GYN exam dated 08/14/2015, she has had no significant changes in her health. Lst week she fell at work and sprained her right ankle. Is currently using a brace and is under the care of an orthopedist. She is sexually active.   Her most recent pap smear was obtained 08/14/2015 and was normal.  Her most recent mammogram obtained on 09/15/2015 was normal and revealed no significant changes. There is no family history of breast cancer. There is no family history of ovarian cancer. The patient does do monthly self breast exams.  She had a colonoscopy 08/29/2014 and it was normal other than for diverticulosis in the sigmoid colon She reported having a  DEXA scan obtained in 2017 that was normal however there is no documentation of this available. Last documented DEXA was in 2009 and this was normal. The patient does not smoke.  The patient does not drink alcohol.  The patient does not use illegal drugs.  The patient exercises regularly.  The patient does get adequate calcium in her diet and with her multivitamin supplement She had a recent cholesterol screen in 2017 that was normal.    The patient denies current symptoms of depression.    Review of Systems: Review of Systems    Constitutional: Negative for chills, fever and weight loss.  HENT: Negative for congestion, sinus pain and sore throat.        Positive for nasal congestion  Eyes: Negative for blurred vision and pain.  Respiratory: Negative for hemoptysis, shortness of breath and wheezing.   Cardiovascular: Negative for chest pain, palpitations and leg swelling.  Gastrointestinal: Negative for abdominal pain, blood in stool, diarrhea, heartburn, nausea and vomiting.  Genitourinary: Positive for frequency (chronic). Negative for dysuria, hematuria and urgency.  Musculoskeletal: Positive for joint pain (right ankle). Negative for back pain and myalgias.  Skin: Negative for itching and rash.  Neurological: Negative for dizziness, tingling and headaches.  Endo/Heme/Allergies: Negative for environmental allergies and polydipsia. Does not bruise/bleed easily.       Negative for hirsutism   Psychiatric/Behavioral: Negative for depression. The patient is not nervous/anxious and does not have insomnia.     Past Medical History:  Past Medical History:  Diagnosis Date  . Allergic rhinitis   . Ankle sprain 08/18/2016   right  . History of mammogram 09/15/2015   neg  . History of mumps   . History of shingles   . Thyroid disease    hypothyroid    Past Surgical History:  Past Surgical History:  Procedure Laterality Date  . ABDOMINAL HYSTERECTOMY  2003   with BSO multiple cyst; BVD  . DILATION AND CURETTAGE OF UTERUS  07/17/1992  . KNEE ARTHROSCOPY  90S   LEFT  . LASIK  05/20/1994  Family History:  Family History  Problem Relation Age of Onset  . Cancer Mother 70        UTERINE  . Hypertension Mother   . COPD Mother   . Congestive Heart Failure Mother   . Alcohol abuse Father   . Hypertension Father   . Congestive Heart Failure Father   . Pneumonia Father        died from aspiration pneumonia  . Melanoma Sister 50       recurrence 2013    Social History:  Social History   Social  History  . Marital status: Married    Spouse name: Barbaraann Rondo  . Number of children: 0  . Years of education: N/A   Occupational History  . Scientist, water quality at [employer] Topics  . Smoking status: Never Smoker  . Smokeless tobacco: Never Used  . Alcohol use No  . Drug use: No  . Sexual activity: Yes    Partners: Male    Birth control/ protection: Surgical   Other Topics Concern  . Not on file   Social History Narrative  . No narrative on file    Allergies:  Allergies  Allergen Reactions  . Codeine Other (See Comments)    Other reaction(s): Abdominal pain; BP spike; dizzy  . Allegra-D  [Fexofenadine-Pseudoephed Er]     Anxious.  . Cephalexin     GI Upset.  . Augmentin [Amoxicillin-Pot Clavulanate] Other (See Comments)    (GI upset)    Medications: Prior to Admission medications   Medication Sig Start Date End Date Taking? Authorizing Provider  estradiol (VIVELLE-DOT) 0.05 MG/24HR patch Place 1 patch (0.05 mg total) onto the skin 2 (two) times a week. 07/18/16  Yes Dalia Heading, CNM  etodolac (LODINE) 400 MG tablet take 1 tablet by mouth twice a day if needed 08/15/15  Yes Fisher, Kirstie Peri, MD  fexofenadine (ALLEGRA) 180 MG tablet Take 1 tablet by mouth daily. 03/25/10  Yes [provider]  fluticasone Asencion Islam) 50 MCG/ACT nasal spray instill 2 sprays into each nostril once daily 12/23/14  Yes Birdie Sons, MD  levothyroxine (SYNTHROID, LEVOTHROID) 88 MCG tablet take 1 tablet by mouth once daily 05/18/16  Yes Fisher, Kirstie Peri, MD  Multiple Vitamins-Minerals (MULTIVITAMIN ADULTS 50+) TABS Take 1 tablet by mouth daily.   Yes [provider]  Omega-3 Fatty Acids (FISH OIL) 1360 MG CAPS Take 1 capsule by mouth daily.   Yes [provider]  Probiotic CAPS Take 1 capsule by mouth daily.   Yes [provider]    Physical Exam Vitals: BP 108/68   Pulse 64   Ht _0  (1.626 m)   Wt 64.4 kg (142 lb)   BMI 24.37 kg/m    General: WF in NAD HEENT: normocephalic, anicteric Neck: no thyroid enlargement, no palpable nodules, no cervical lymphadenopathy  Pulmonary: No increased work of breathing, CTAB Cardiovascular: RRR, without murmur  Breast: Breast symmetrical, no tenderness, no palpable nodules or masses, no skin or nipple retraction present, no nipple discharge.  No axillary, infraclavicular or supraclavicular lymphadenopathy. Abdomen: Soft, non-tender, non-distended.  Umbilicus without lesions.  No hepatomegaly or masses palpable. No evidence of hernia. Genitourinary:  External: mild atrophic changes. Normal urethral meatus, normal Bartholin's and Skene's glands. No inflammation or lesions   Vagina: Normal vaginal mucosa, no evidence of prolapse, some atrophy, barely emits two fingers for SVE   Cervix: surgically absent  Uterus: surgically absent  Adnexa: No adnexal masses,  non-tender  Rectal: deferred  Lymphatic: no evidence of inguinal lymphadenopathy Extremities: no edema, erythema, or tenderness. Multiple varicose veins below the knee bilaterally. Neurologic: Grossly intact Psychiatric: mood appropriate, affect full     Assessment: 56 y.o.  Postmenopausal well woman exam  Plan:  1) Breast cancer screening - recommend monthly self breast exam and annual mammogram. Patient to schedule screening mammogram after 7/6 at [facility]  2)  Pap was done. ASCCP guidelines and rational discussed.  Patient opts for yearly screening interval  3) Colon cancer screen: colonoscopy UTD  4) Routine healthcare maintenance including cholesterol and diabetes screening managed by PCP   5) Dexa scan ordered. Refilled Vivelle patch 0.05 mgm x 1 year. Reviewed calcium and vitamin D3 requirements  6) Follow up for annual in 1 year.  Dalia Heading, CNM

## 2016-08-26 NOTE — Telephone Encounter (Signed)
Please order. Thank you. 

## 2016-08-28 ENCOUNTER — Encounter: Payer: Self-pay | Admitting: Certified Nurse Midwife

## 2016-08-29 LAB — IGP,RFX APTIMA HPV ALL PTH: PAP Smear Comment: 0

## 2016-08-31 ENCOUNTER — Other Ambulatory Visit: Payer: Self-pay | Admitting: Certified Nurse Midwife

## 2016-08-31 ENCOUNTER — Telehealth: Payer: Self-pay

## 2016-08-31 NOTE — Telephone Encounter (Signed)
Pt calling urgently for an order for bone scan be faxed to [facility].  The order is in [place] but [place] is unable to get it from there.  Pt has appt for mammogram on 7/13th at 8:30am and would like for bone scan to coincide c that.  Fax# for [facility] [phone].  Pt's cb# [phone]

## 2016-08-31 NOTE — Telephone Encounter (Signed)
Order faxed.

## 2016-08-31 NOTE — Telephone Encounter (Signed)
I have requested that Lisa Skinner send the request to [facility]

## 2016-08-31 NOTE — Telephone Encounter (Signed)
Please update order.  Thank you 

## 2016-09-02 ENCOUNTER — Other Ambulatory Visit: Payer: Self-pay | Admitting: Certified Nurse Midwife

## 2016-09-02 ENCOUNTER — Encounter: Payer: Self-pay | Admitting: Certified Nurse Midwife

## 2016-09-16 ENCOUNTER — Encounter: Payer: Self-pay | Admitting: Certified Nurse Midwife

## 2016-09-27 ENCOUNTER — Telehealth: Payer: Self-pay

## 2016-09-27 ENCOUNTER — Encounter: Payer: Self-pay | Admitting: Certified Nurse Midwife

## 2016-09-27 NOTE — Telephone Encounter (Signed)
Pt aware dexa scan normal.

## 2016-09-27 NOTE — Telephone Encounter (Signed)
Pt returning a missed call, no msg left.  [phone] or w# [phone]

## 2016-10-04 ENCOUNTER — Other Ambulatory Visit: Payer: Self-pay | Admitting: Family Medicine

## 2016-10-04 MED ORDER — LEVOTHYROXINE SODIUM 88 MCG PO TABS: 88.0000 ug | ORAL_TABLET | Freq: Every day | ORAL | 2 refills | Status: DC

## 2016-10-04 NOTE — Telephone Encounter (Signed)
[place] faxed a request on the following medication. Thanks CC  levothyroxine (SYNTHROID, LEVOTHROID) 88 MCG tablet  >Take 1 tablet by mouth once daily.

## 2016-10-21 ENCOUNTER — Encounter: Payer: Self-pay | Admitting: Family Medicine

## 2017-01-25 ENCOUNTER — Ambulatory Visit: Payer: BC Managed Care – PPO | Admitting: Family Medicine

## 2017-01-25 ENCOUNTER — Encounter: Payer: Self-pay | Admitting: Family Medicine

## 2017-01-25 VITALS — BP 120/84 | HR 60 | Temp 98.1°F | Resp 16 | Wt 145.0 lb

## 2017-01-25 DIAGNOSIS — J069 Acute upper respiratory infection, unspecified: Secondary | ICD-10-CM | POA: Diagnosis not present

## 2017-01-25 DIAGNOSIS — J0100 Acute maxillary sinusitis, unspecified: Secondary | ICD-10-CM | POA: Diagnosis not present

## 2017-01-25 DIAGNOSIS — M5126 Other intervertebral disc displacement, lumbar region: Secondary | ICD-10-CM

## 2017-01-25 MED ORDER — ETODOLAC 400 MG PO TABS
ORAL_TABLET | ORAL | 4 refills | Status: DC
Start: 2017-01-25 — End: 2018-02-23

## 2017-01-25 MED ORDER — AMOXICILLIN 500 MG PO CAPS: 1000.0000 mg | ORAL_CAPSULE | Freq: Two times a day (BID) | ORAL | 0 refills | Status: DC

## 2017-01-25 NOTE — Patient Instructions (Addendum)
   Try OTC zinc lozenges such as Cold-eez to help get over vital infection quicker    Upper Respiratory Infection, Adult Most upper respiratory infections (URIs) are caused by a virus. A URI affects the nose, throat, and upper air passages. The most common type of URI is often called "the common cold." Follow these instructions at home:  Take medicines only as told by your doctor.  Gargle warm saltwater or take cough drops to comfort your throat as told by your doctor.  Use a warm mist humidifier or inhale steam from a shower to increase air moisture. This may make it easier to breathe.  Drink enough fluid to keep your pee (urine) clear or pale yellow.  Eat soups and other clear broths.  Have a healthy diet.  Rest as needed.  Go back to work when your fever is gone or your doctor says it is okay. ? You may need to stay home longer to avoid giving your URI to others. ? You can also wear a face mask and wash your hands often to prevent spread of the virus.  Use your inhaler more if you have asthma.  Do not use any tobacco products, including cigarettes, chewing tobacco, or electronic cigarettes. If you need help quitting, ask your doctor. Contact a doctor if:  You are getting worse, not better.  Your symptoms are not helped by medicine.  You have chills.  You are getting more short of breath.  You have brown or red mucus.  You have yellow or brown discharge from your nose.  You have pain in your face, especially when you bend forward.  You have a fever.  You have puffy (swollen) neck glands.  You have pain while swallowing.  You have white areas in the back of your throat. Get help right away if:  You have very bad or constant: ? Headache. ? Ear pain. ? Pain in your forehead, behind your eyes, and over your cheekbones (sinus pain). ? Chest pain.  You have long-lasting (chronic) lung disease and any of the following: ? Wheezing. ? Long-lasting  cough. ? Coughing up blood. ? A change in your usual mucus.  You have a stiff neck.  You have changes in your: ? Vision. ? Hearing. ? Thinking. ? Mood. This information is not intended to replace advice given to you by your health care provider. Make sure you discuss any questions you have with your health care provider. Document Released: 08/10/2007 Document Revised: 10/25/2015 Document Reviewed: 05/29/2013 Elsevier Interactive Patient Education  2018 [employer].

## 2017-01-25 NOTE — Progress Notes (Signed)
Patient: Lisa Skinner Female    DOB: September 02, 1960   56 y.o.   MRN: [id number] Visit Date: 01/25/2017  Today's Provider: Lelon Huh, MD   Chief Complaint  Patient presents with  . Sinusitis   Subjective:    Patient has symptoms of sinus pressure and scratchy throat x6 days. Symptoms have gradually worsened over the week. Also has symptoms include mild bilateral ear pain, slight cough, itchy throat, chills and headaches. Patient is taking otc tylenol and sudafed DM with mild relief.                                                                                                       Sinusitis  This is a new problem. The current episode started in the past 7 days (6 days). The problem has been gradually worsening since onset. There has been no fever. She is experiencing no pain. Associated symptoms include chills, congestion, coughing, ear pain, headaches, sinus pressure and swollen glands. Pertinent negatives include no diaphoresis, hoarse voice, neck pain, shortness of breath, sneezing or sore throat. Treatments tried: sudafed and tylenol. The treatment provided mild relief.      Allergies  Allergen Reactions  . Codeine Other (See Comments)    Other reaction(s): Abdominal pain; BP spike; dizzy  . Allegra-D  [Fexofenadine-Pseudoephed Er]     Anxious.  . Cephalexin     GI Upset.  . Augmentin [Amoxicillin-Pot Clavulanate] Other (See Comments)    (GI upset)     Current Outpatient Medications:  .  estradiol (VIVELLE-DOT) 0.05 MG/24HR patch, Place 1 patch (0.05 mg total) onto the skin 2 (two) times a week., Disp: 8 patch, Rfl: 11 .  etodolac (LODINE) 400 MG tablet, take 1 tablet by mouth twice a day if needed, Disp: 30 tablet, Rfl: 5 .  fexofenadine (ALLEGRA) 180 MG tablet, Take 1 tablet by mouth daily., Disp: , Rfl:  .  fluticasone (FLONASE) 50 MCG/ACT nasal spray, instill 2 sprays into each nostril once daily, Disp: 16 g, Rfl: 6 .  levothyroxine (SYNTHROID, LEVOTHROID) 88  MCG tablet, Take 1 tablet (88 mcg total) by mouth daily., Disp: 90 tablet, Rfl: 2 .  Multiple Vitamins-Minerals (MULTIVITAMIN ADULTS 50+) TABS, Take 1 tablet by mouth daily., Disp: , Rfl:  .  Omega-3 Fatty Acids (FISH OIL) 1360 MG CAPS, Take 1 capsule by mouth daily., Disp: , Rfl:  .  Probiotic CAPS, Take 1 capsule by mouth daily., Disp: , Rfl:   Review of Systems  Constitutional: Positive for chills. Negative for appetite change, diaphoresis, fatigue and fever.  HENT: Positive for congestion, ear pain, sinus pressure and sinus pain. Negative for hoarse voice, sneezing and sore throat.   Respiratory: Positive for cough. Negative for chest tightness and shortness of breath.   Cardiovascular: Negative for chest pain and palpitations.  Gastrointestinal: Negative for abdominal pain, nausea and vomiting.  Musculoskeletal: Negative for neck pain.  Neurological: Positive for headaches. Negative for dizziness and weakness.    Social History   Tobacco Use  . Smoking status: Never Smoker  .  Smokeless tobacco: Never Used  Substance Use Topics  . Alcohol use: No    Alcohol/week: 0.0 oz   Objective:   BP 120/84 (BP Location: Right Arm, Patient Position: Sitting, Cuff Size: Large)   Pulse 60   Temp 98.1 F (36.7 C) (Oral)   Resp 16   Wt 145 lb (65.8 kg)   SpO2 99%   BMI 24.89 kg/m    Physical Exam  General Appearance:    Alert, cooperative, no distress  HENT:   bilateral TM normal without fluid or infection, neck has bilateral anterior cervical nodes enlarged, pharynx erythematous without exudate, frontal and maxillary sinus tenderness and nasal mucosa pale and congested  Eyes:    PERRL, conjunctiva/corneas clear, EOM's intact       Lungs:     Clear to auscultation bilaterally, respirations unlabored  Heart:    Regular rate and rhythm  Neurologic:   Awake, alert, oriented x 3. No apparent focal neurological           defect.           Assessment & Plan:     1. Acute maxillary  sinusitis, recurrence not specified  - amoxicillin (AMOXIL) 500 MG capsule; Take 2 capsules (1,000 mg total) by mouth 2 (two) times daily for 7 days.  Dispense: 28 capsule; Refill: 0  2. Upper respiratory tract infection, unspecified type   3. Displacement of lumbar intervertebral disc without myelopathy Needs refill etodolac (LODINE) 400 MG tablet; take 1 tablet by mouth twice a day if needed  Dispense: 30 tablet; Refill: [place], MD  [facility]

## 2017-02-01 ENCOUNTER — Telehealth: Payer: Self-pay | Admitting: Family Medicine

## 2017-02-01 DIAGNOSIS — J0100 Acute maxillary sinusitis, unspecified: Secondary | ICD-10-CM

## 2017-02-01 MED ORDER — AMOXICILLIN 500 MG PO CAPS: 1000.0000 mg | ORAL_CAPSULE | Freq: Two times a day (BID) | ORAL | 0 refills | Status: AC

## 2017-02-01 NOTE — Telephone Encounter (Signed)
Please advise 

## 2017-02-01 NOTE — Telephone Encounter (Signed)
Pt stated that she has been taking amoxicillin (AMOXIL) 500 MG capsule as directed but still has symptoms of cough & congestion. Pt stated she is feeling better but not over it completely. Pt is requesting another round of amoxicillin (AMOXIL) 500 MG capsule to be sent to [facility]. Please advise. Thanks TNP

## 2017-05-04 ENCOUNTER — Telehealth: Payer: Self-pay | Admitting: Family Medicine

## 2017-05-04 MED ORDER — CIPROFLOXACIN HCL 0.3 % OP SOLN: 1.0000 [drp] | OPHTHALMIC | 0 refills | Status: AC

## 2017-05-04 NOTE — Telephone Encounter (Signed)
Up to Dr Caryn Section.I think we used Cipro drops

## 2017-05-04 NOTE — Telephone Encounter (Signed)
Prescription sent to [facility] in [place] 

## 2017-05-04 NOTE — Telephone Encounter (Signed)
Pt advised.

## 2017-05-04 NOTE — Telephone Encounter (Signed)
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 She is having trouble with her left eye only. Eye redness, watery eye, matted eyes, and itchy eyes. She did not want to come in have to pay a copay if since she had the same thing as her husband. Please advise. Thanks.

## 2017-05-04 NOTE — Telephone Encounter (Signed)
Pt stated that her husband saw Dr. Rosanna Randy a couple days ago for conjunctivitis and pt is now having the same symptoms and is asking for eye drops Rx to be sent to [facility]. Pt stated that she can't take time off work to come in for [place]. Please advise. Thanks TNP

## 2017-07-25 ENCOUNTER — Encounter: Payer: Self-pay | Admitting: Family Medicine

## 2017-07-25 ENCOUNTER — Encounter: Payer: Self-pay | Admitting: Obstetrics & Gynecology

## 2017-07-26 NOTE — Telephone Encounter (Signed)
Do you know answer to this?

## 2017-08-03 ENCOUNTER — Other Ambulatory Visit: Payer: Self-pay | Admitting: Certified Nurse Midwife

## 2017-08-15 ENCOUNTER — Other Ambulatory Visit: Payer: Self-pay | Admitting: Family Medicine

## 2017-09-04 ENCOUNTER — Other Ambulatory Visit (HOSPITAL_COMMUNITY)
Admission: RE | Admit: 2017-09-04 | Discharge: 2017-09-04 | Disposition: A | Discharge status: Home / Self Care | Payer: BC Managed Care – PPO | Source: Ambulatory Visit | Attending: Obstetrics & Gynecology | Admitting: Obstetrics & Gynecology

## 2017-09-04 ENCOUNTER — Ambulatory Visit (INDEPENDENT_AMBULATORY_CARE_PROVIDER_SITE_OTHER): Payer: BC Managed Care – PPO | Admitting: Obstetrics & Gynecology

## 2017-09-04 ENCOUNTER — Encounter: Payer: Self-pay | Admitting: Obstetrics & Gynecology

## 2017-09-04 VITALS — BP 120/80 | Ht 64.0 in | Wt 153.0 lb

## 2017-09-04 DIAGNOSIS — Z1239 Encounter for other screening for malignant neoplasm of breast: Secondary | ICD-10-CM

## 2017-09-04 DIAGNOSIS — Z1272 Encounter for screening for malignant neoplasm of vagina: Secondary | ICD-10-CM | POA: Insufficient documentation

## 2017-09-04 DIAGNOSIS — Z79890 Hormone replacement therapy (postmenopausal): Secondary | ICD-10-CM

## 2017-09-04 DIAGNOSIS — N941 Unspecified dyspareunia: Secondary | ICD-10-CM | POA: Diagnosis not present

## 2017-09-04 DIAGNOSIS — Z1211 Encounter for screening for malignant neoplasm of colon: Secondary | ICD-10-CM

## 2017-09-04 DIAGNOSIS — Z1231 Encounter for screening mammogram for malignant neoplasm of breast: Secondary | ICD-10-CM | POA: Diagnosis not present

## 2017-09-04 DIAGNOSIS — Z01411 Encounter for gynecological examination (general) (routine) with abnormal findings: Secondary | ICD-10-CM

## 2017-09-04 DIAGNOSIS — Z0000 Encounter for general adult medical examination without abnormal findings: Secondary | ICD-10-CM

## 2017-09-04 MED ORDER — ESTRADIOL 0.05 MG/24HR TD PTTW: MEDICATED_PATCH | TRANSDERMAL | 12 refills | Status: DC

## 2017-09-04 MED ORDER — REPLENS VA GEL: 1.0000 | VAGINAL | 11 refills | Status: DC

## 2017-09-04 NOTE — Patient Instructions (Addendum)
PAP every 5 years Mammogram every year    Call [phone] to schedule at [place] we can schedule at [place] Colonoscopy every 10 years (last 2016) Labs yearly (with PCP)   Kegel Exercises Kegel exercises help strengthen the muscles that support the rectum, vagina, small intestine, bladder, and uterus. Doing Kegel exercises can help:  Improve bladder and bowel control.  Improve sexual response.  Reduce problems and discomfort during pregnancy.  Kegel exercises involve squeezing your pelvic floor muscles, which are the same muscles you squeeze when you try to stop the flow of urine. The exercises can be done while sitting, standing, or lying down, but it is best to vary your position. Phase 1 exercises 1. Squeeze your pelvic floor muscles tight. You should feel a tight lift in your rectal area. If you are a female, you should also feel a tightness in your vaginal area. Keep your stomach, buttocks, and legs relaxed. 2. Hold the muscles tight for up to 10 seconds. 3. Relax your muscles. Repeat this exercise 50 times a day or as many times as told by your health care provider. Continue to do this exercise for at least 4-6 weeks or for as long as told by your health care provider. This information is not intended to replace advice given to you by your health care provider. Make sure you discuss any questions you have with your health care provider. Document Released: 02/08/2012 Document Revised: 10/17/2015 Document Reviewed: 01/11/2015 Elsevier Interactive Patient Education  [employer].

## 2017-09-04 NOTE — Progress Notes (Signed)
HPI:      [name] is a 57 y.o. G0P0000 who LMP was in the past, she presents today for her annual examination.  The patient has no complaints today.  Prior TAH BSO and has been on HRT for years.   Has been on slow taper, still has hot flashes and sweats, also has vag dryness and occas pain w intercourse. The patient is sexually active. Herlast pap: approximate date 2018 and was normal and last mammogram: approximate date 2018 and was normal.  The patient does perform self breast exams.  There is no notable family history of breast or ovarian cancer in her family. The patient is taking hormone replacement therapy. Patient denies post-menopausal vaginal bleeding.   The patient has regular exercise: yes. The patient denies current symptoms of depression.    GYN Hx: Last Colonoscopy:3 years ago. Normal.  Last DEXA: year ago.    PMHx: Past Medical History:  Diagnosis Date  . Allergic rhinitis   . Ankle sprain 08/18/2016   right  . History of mammogram 09/15/2015   neg  . History of mumps   . History of shingles   . Thyroid disease    hypothyroid   Past Surgical History:  Procedure Laterality Date  . ABDOMINAL HYSTERECTOMY  2003   with BSO multiple cyst; BVD  . DILATION AND CURETTAGE OF UTERUS  07/17/1992  . KNEE ARTHROSCOPY  90S   LEFT  . LASIK  05/20/1994   Family History  Problem Relation Age of Onset  . Cancer Mother 60        UTERINE  . Hypertension Mother   . COPD Mother   . Congestive Heart Failure Mother   . Alcohol abuse Father   . Hypertension Father   . Congestive Heart Failure Father   . Pneumonia Father        died from aspiration pneumonia  . Melanoma Sister 34       recurrence 2013   Social History   Tobacco Use  . Smoking status: Never Smoker  . Smokeless tobacco: Never Used  Substance Use Topics  . Alcohol use: No    Alcohol/week: 0.0 oz  . Drug use: No    Current Outpatient Medications:  .  etodolac (LODINE) 400 MG tablet, take 1  tablet by mouth twice a day if needed, Disp: 30 tablet, Rfl: 4 .  fexofenadine (ALLEGRA) 180 MG tablet, Take 1 tablet by mouth daily., Disp: , Rfl:  .  fluticasone (FLONASE) 50 MCG/ACT nasal spray, instill 2 sprays into each nostril once daily, Disp: 16 g, Rfl: 6 .  levothyroxine (SYNTHROID, LEVOTHROID) 88 MCG tablet, TAKE 1 TABLET(88 MCG) BY MOUTH DAILY, Disp: 90 tablet, Rfl: 3 .  MINIVELLE 0.05 MG/24HR patch, APPLY 1 PATCH TWO TIMES A WEEK, Disp: 8 patch, Rfl: 1 .  Multiple Vitamins-Minerals (MULTIVITAMIN ADULTS 50+) TABS, Take 1 tablet by mouth daily., Disp: , Rfl:  .  Omega-3 Fatty Acids (FISH OIL) 1360 MG CAPS, Take 1 capsule by mouth daily., Disp: , Rfl:  .  Probiotic CAPS, Take 1 capsule by mouth daily., Disp: , Rfl:  Allergies: Codeine; Allegra-d  [fexofenadine-pseudoephed er]; Cephalexin; and Augmentin [amoxicillin-pot clavulanate]  Review of Systems  Constitutional: Negative for chills, fever and malaise/fatigue.  HENT: Negative for congestion, sinus pain and sore throat.   Eyes: Negative for blurred vision and pain.  Respiratory: Negative for cough and wheezing.   Cardiovascular: Negative for chest pain and leg swelling.  Gastrointestinal: Negative for abdominal  pain, constipation, diarrhea, heartburn, nausea and vomiting.  Genitourinary: Negative for dysuria, frequency, hematuria and urgency.  Musculoskeletal: Negative for back pain, joint pain, myalgias and neck pain.  Skin: Negative for itching and rash.  Neurological: Negative for dizziness, tremors and weakness.  Endo/Heme/Allergies: Does not bruise/bleed easily.  Psychiatric/Behavioral: Negative for depression. The patient is not nervous/anxious and does not have insomnia.    Objective: BP 120/80   Ht 5\' 4"  (1.626 m)   Wt 153 lb (69.4 kg)   BMI 26.26 kg/m   Filed Weights   09/04/17 0804  Weight: 153 lb (69.4 kg)   Body mass index is 26.26 kg/m. Physical Exam  Constitutional: She is oriented to person, place, and  time. She appears well-developed and well-nourished. No distress.  Genitourinary: Rectum normal and vagina normal. Pelvic exam was performed with patient supine. There is no rash or lesion on the right labia. There is no rash or lesion on the left labia. Vagina exhibits no lesion. No bleeding in the vagina. Right adnexum does not display mass and does not display tenderness. Left adnexum does not display mass and does not display tenderness.  Genitourinary Comments: Absent Uterus Absent cervix Vaginal cuff well healed  HENT:  Head: Normocephalic and atraumatic. Head is without laceration.  Right Ear: Hearing normal.  Left Ear: Hearing normal.  Nose: No epistaxis.  No foreign bodies.  Mouth/Throat: Uvula is midline, oropharynx is clear and moist and mucous membranes are normal.  Eyes: Pupils are equal, round, and reactive to light.  Neck: Normal range of motion. Neck supple. No thyromegaly present.  Cardiovascular: Normal rate and regular rhythm. Exam reveals no gallop and no friction rub.  No murmur heard. Pulmonary/Chest: Effort normal and breath sounds normal. No respiratory distress. She has no wheezes. Right breast exhibits no mass, no skin change and no tenderness. Left breast exhibits no mass, no skin change and no tenderness.  Abdominal: Soft. Bowel sounds are normal. She exhibits no distension. There is no tenderness. There is no rebound.  Musculoskeletal: Normal range of motion.  Neurological: She is alert and oriented to person, place, and time. No cranial nerve deficit.  Skin: Skin is warm and dry.  Psychiatric: She has a normal mood and affect. Judgment normal.  Vitals reviewed.  Assessment: Annual Exam 1. Annual physical exam   2. Screening for breast cancer    Plan:            1.  Vaginal Screening-  Pap smear done today (prefers annually)  2. Breast screening- Exam annually and mammogram scheduled ([facility])  3. Colonoscopy every 10 years, Hemoccult testing after age  38  4. Labs managed by PCP  5. Counseling for hormonal therapy: no change in therapy today. Consider further taper over time.  6. Vag dryness, add Replens to her regimen.  Discussed. Alternative options discussed as well.  7. Kegels advised.    F/U  Return in about 1 year (around 09/05/2018) for Annual.  Barnett Applebaum, MD, [facility], [facility] 09/04/2017  8:14 AM

## 2017-09-06 LAB — CYTOLOGY - PAP: Diagnosis: NEGATIVE

## 2017-09-07 LAB — SPECIMEN STATUS REPORT

## 2017-09-07 LAB — FECAL OCCULT BLOOD, IMMUNOCHEMICAL: Fecal Occult Bld: NEGATIVE

## 2017-09-14 ENCOUNTER — Encounter: Payer: Self-pay | Admitting: Family Medicine

## 2017-09-14 DIAGNOSIS — M67432 Ganglion, left wrist: Secondary | ICD-10-CM

## 2017-09-14 DIAGNOSIS — M67431 Ganglion, right wrist: Secondary | ICD-10-CM

## 2017-09-18 ENCOUNTER — Encounter: Payer: Self-pay | Admitting: Family Medicine

## 2017-09-29 ENCOUNTER — Other Ambulatory Visit: Payer: Self-pay | Admitting: Certified Nurse Midwife

## 2017-09-29 ENCOUNTER — Encounter: Payer: Self-pay | Admitting: Family Medicine

## 2017-09-29 DIAGNOSIS — K130 Diseases of lips: Secondary | ICD-10-CM

## 2017-10-02 ENCOUNTER — Encounter: Payer: Self-pay | Admitting: Obstetrics & Gynecology

## 2017-10-30 ENCOUNTER — Telehealth: Payer: Self-pay | Admitting: Family Medicine

## 2017-10-30 ENCOUNTER — Encounter: Payer: Self-pay | Admitting: Family Medicine

## 2017-10-30 NOTE — Telephone Encounter (Signed)
I called patient to schedule an appointment. Patient states she is not able to take any time off of work to come in for an appointment since she works in [place]. Patient states she is going to try using the E-VISIT on Mychart this time.

## 2017-10-30 NOTE — Telephone Encounter (Signed)
Patient sent mychart message regarding some sinus sx. Please advise she can make appointment for office visit to have this evaluated

## 2017-11-08 ENCOUNTER — Encounter: Payer: Self-pay | Admitting: Obstetrics & Gynecology

## 2017-11-08 ENCOUNTER — Other Ambulatory Visit: Payer: Self-pay | Admitting: Obstetrics & Gynecology

## 2017-11-08 MED ORDER — ESTRADIOL 0.05 MG/24HR TD PTWK: 0.0500 mg | MEDICATED_PATCH | TRANSDERMAL | 12 refills | Status: DC

## 2017-11-08 NOTE — Telephone Encounter (Signed)
Please advise 

## 2017-11-09 ENCOUNTER — Telehealth: Payer: BC Managed Care – PPO | Admitting: Family

## 2017-11-09 DIAGNOSIS — B9689 Other specified bacterial agents as the cause of diseases classified elsewhere: Secondary | ICD-10-CM

## 2017-11-09 DIAGNOSIS — J028 Acute pharyngitis due to other specified organisms: Secondary | ICD-10-CM

## 2017-11-09 MED ORDER — BENZONATATE 100 MG PO CAPS: 100.0000 mg | ORAL_CAPSULE | Freq: Three times a day (TID) | ORAL | 0 refills | Status: DC | PRN

## 2017-11-09 MED ORDER — PREDNISONE 5 MG PO TABS: 5.0000 mg | ORAL_TABLET | ORAL | 0 refills | Status: DC

## 2017-11-09 MED ORDER — AZITHROMYCIN 250 MG PO TABS: ORAL_TABLET | ORAL | 0 refills | Status: DC

## 2017-11-09 NOTE — Progress Notes (Signed)
Thank you for the details you included in the comment boxes. Those details are very helpful in determining the best course of treatment for you and help [place] to provide the best care. Given that you also have a cough as well, this is more of a sinusitis/pharyngitis. See plan below; this covers the ear pain, sinus, and cough. As far as insurance, I'm not sure. I would call the plan directly as each plan has 3-5 options within that plan so we don't know the exact details of each person. Your insurance agent or customer service should be able to tell you.  We are sorry that you are not feeling well.  Here is how we plan to help!  Based on your presentation I believe you most likely have A cough due to bacteria.  When patients have a fever and a productive cough with a change in color or increased sputum production, we are concerned about bacterial bronchitis.  If left untreated it can progress to pneumonia.  If your symptoms do not improve with your treatment plan it is important that you contact your provider.   I have prescribed Azithromyin 250 mg: two tablets now and then one tablet daily for 4 additonal days    In addition you may use A non-prescription cough medication called Mucinex DM: take 2 tablets every 12 hours. and A prescription cough medication called Tessalon Perles 100mg . You may take 1-2 capsules every 8 hours as needed for your cough.  Prednisone 5 mg daily for 6 days (see taper instructions below)  Directions for 6 day taper: Day 1: 2 tablets before breakfast, 1 after both lunch & dinner and 2 at bedtime Day 2: 1 tab before breakfast, 1 after both lunch & dinner and 2 at bedtime Day 3: 1 tab at each meal & 1 at bedtime Day 4: 1 tab at breakfast, 1 at lunch, 1 at bedtime Day 5: 1 tab at breakfast & 1 tab at bedtime Day 6: 1 tab at breakfast   From your responses in the eVisit questionnaire you describe inflammation in the upper respiratory tract which is causing a significant cough.   This is commonly called Bronchitis and has four common causes:    Allergies  Viral Infections  Acid Reflux  Bacterial Infection Allergies, viruses and acid reflux are treated by controlling symptoms or eliminating the cause. An example might be a cough caused by taking certain blood pressure medications. You stop the cough by changing the medication. Another example might be a cough caused by acid reflux. Controlling the reflux helps control the cough.  USE OF BRONCHODILATOR ("RESCUE") INHALERS: There is a risk from using your bronchodilator too frequently.  The risk is that over-reliance on a medication which only relaxes the muscles surrounding the breathing tubes can reduce the effectiveness of medications prescribed to reduce swelling and congestion of the tubes themselves.  Although you feel brief relief from the bronchodilator inhaler, your asthma may actually be worsening with the tubes becoming more swollen and filled with mucus.  This can delay other crucial treatments, such as oral steroid medications. If you need to use a bronchodilator inhaler daily, several times per day, you should discuss this with your provider.  There are probably better treatments that could be used to keep your asthma under control.     HOME CARE . Only take medications as instructed by your medical team. . Complete the entire course of an antibiotic. . Drink plenty of fluids and get plenty of  rest. . Avoid close contacts especially the very young and the elderly . Cover your mouth if you cough or cough into your sleeve. . Always remember to wash your hands . A steam or ultrasonic humidifier can help congestion.   GET HELP RIGHT AWAY IF: . You develop worsening fever. . You become short of breath . You cough up blood. . Your symptoms persist after you have completed your treatment plan MAKE SURE YOU   Understand these instructions.  Will watch your condition.  Will get help right away if you are  not doing well or get worse.  Your e-visit answers were reviewed by a board certified advanced clinical practitioner to complete your personal care plan.  Depending on the condition, your plan could have included both over the counter or prescription medications. If there is a problem please reply  once you have received a response from your provider. Your safety is important to [place].  If you have drug allergies check your prescription carefully.    You can use MyChart to ask questions about today's visit, request a non-urgent call back, or ask for a work or school excuse for 24 hours related to this e-Visit. If it has been greater than 24 hours you will need to follow up with your provider, or enter a new e-Visit to address those concerns. You will get an e-mail in the next two days asking about your experience.  I hope that your e-visit has been valuable and will speed your recovery. Thank you for using e-visits.

## 2017-11-20 ENCOUNTER — Encounter: Payer: Self-pay | Admitting: Obstetrics & Gynecology

## 2017-11-20 ENCOUNTER — Other Ambulatory Visit: Payer: Self-pay | Admitting: Obstetrics & Gynecology

## 2017-11-20 MED ORDER — ESTRADIOL 0.05 MG/24HR TD PTTW: 1.0000 | MEDICATED_PATCH | TRANSDERMAL | 12 refills | Status: DC

## 2017-11-20 NOTE — Telephone Encounter (Signed)
Please advise 

## 2017-12-12 ENCOUNTER — Telehealth: Payer: Self-pay

## 2017-12-12 ENCOUNTER — Encounter: Payer: Self-pay | Admitting: Obstetrics & Gynecology

## 2017-12-12 NOTE — Telephone Encounter (Signed)
-----   Message from Gae Dry, MD sent at 12/10/2017 11:40 AM EDT ----- Regarding: MMG Received notice she has not received MMG yet as ordered at her Annual. Please check and encourage her to do this, and document conversation.

## 2017-12-12 NOTE — Telephone Encounter (Signed)
Left message to remind pt to get mammo

## 2018-02-23 ENCOUNTER — Encounter: Payer: Self-pay | Admitting: Family Medicine

## 2018-02-23 ENCOUNTER — Other Ambulatory Visit: Payer: Self-pay | Admitting: Family Medicine

## 2018-02-23 DIAGNOSIS — M5126 Other intervertebral disc displacement, lumbar region: Secondary | ICD-10-CM

## 2018-02-23 MED ORDER — ETODOLAC 400 MG PO TABS: ORAL_TABLET | ORAL | 4 refills | Status: DC

## 2018-02-23 NOTE — Telephone Encounter (Signed)
Patient needs refills on Etodolac 400 mg. sent to [facility] in [place]

## 2018-03-27 ENCOUNTER — Encounter: Payer: Self-pay | Admitting: Family Medicine

## 2018-03-27 ENCOUNTER — Ambulatory Visit
Admission: RE | Admit: 2018-03-27 | Discharge: 2018-03-27 | Disposition: A | Discharge status: Home / Self Care | Payer: BC Managed Care – PPO | Source: Ambulatory Visit | Attending: Family Medicine | Admitting: Family Medicine

## 2018-03-27 ENCOUNTER — Ambulatory Visit
Admission: RE | Admit: 2018-03-27 | Discharge: 2018-03-27 | Disposition: A | Discharge status: Home / Self Care | Payer: BC Managed Care – PPO | Attending: Family Medicine | Admitting: Family Medicine

## 2018-03-27 ENCOUNTER — Ambulatory Visit: Payer: BC Managed Care – PPO | Admitting: Family Medicine

## 2018-03-27 VITALS — BP 136/78 | HR 61 | Temp 97.8°F | Resp 16 | Wt 146.0 lb

## 2018-03-27 DIAGNOSIS — M545 Low back pain, unspecified: Secondary | ICD-10-CM

## 2018-03-27 DIAGNOSIS — M26621 Arthralgia of right temporomandibular joint: Secondary | ICD-10-CM

## 2018-03-27 DIAGNOSIS — M79604 Pain in right leg: Secondary | ICD-10-CM

## 2018-03-27 DIAGNOSIS — M5126 Other intervertebral disc displacement, lumbar region: Secondary | ICD-10-CM | POA: Diagnosis not present

## 2018-03-27 MED ORDER — PREDNISONE 10 MG PO TABS: ORAL_TABLET | ORAL | 0 refills | Status: DC

## 2018-03-27 NOTE — Patient Instructions (Addendum)
.   Go to the [facility] on [address] for low back Xray  .  

## 2018-03-27 NOTE — Progress Notes (Signed)
Patient: Lisa Skinner Female    DOB: April 25, 1960   58 y.o.   MRN: [id number] Visit Date: 03/27/2018  Today's Provider: Lelon Huh, MD   Chief Complaint  Patient presents with  . Back Pain   Subjective:     Back Pain  This is a chronic (started in 2007) problem. The problem has been gradually worsening (for the past month) since onset. The pain is present in the lumbar spine. Quality: throbbing pain. Radiates to: the front of her right leg. Associated symptoms include numbness (in right hand). Risk factors: history of herniated disc. Treatments tried: Etodolac and Tens unit. The treatment provided moderate relief.  She did have MRI in 2007 showing extensive disk disease. She was treated with prednisone which she thinks worked well for her hasn't had any major flares since then.  She has also had trouble with TMJ flaring up lately. Was seen by dentist and advised to stop chewing gum, which helped, but she has since started again.    Allergies  Allergen Reactions  . Codeine Other (See Comments)    Other reaction(s): Abdominal pain; BP spike; dizzy  . Allegra-D  [Fexofenadine-Pseudoephed Er]     Anxious.  . Cephalexin     GI Upset.  . Augmentin [Amoxicillin-Pot Clavulanate] Other (See Comments)    (GI upset)     Current Outpatient Medications:  .  estradiol (VIVELLE-DOT) 0.05 MG/24HR patch, Place 1 patch (0.05 mg total) onto the skin 2 (two) times a week., Disp: 8 patch, Rfl: 12 .  etodolac (LODINE) 400 MG tablet, take 1 tablet by mouth twice a day if needed, Disp: 30 tablet, Rfl: 4 .  fexofenadine (ALLEGRA) 180 MG tablet, Take 1 tablet by mouth daily., Disp: , Rfl:  .  fluticasone (FLONASE) 50 MCG/ACT nasal spray, instill 2 sprays into each nostril once daily, Disp: 16 g, Rfl: 6 .  levothyroxine (SYNTHROID, LEVOTHROID) 88 MCG tablet, TAKE 1 TABLET(88 MCG) BY MOUTH DAILY, Disp: 90 tablet, Rfl: 3 .  Multiple Vitamins-Minerals (MULTIVITAMIN ADULTS 50+) TABS, Take 1 tablet  by mouth daily., Disp: , Rfl:  .  Omega-3 Fatty Acids (FISH OIL) 1360 MG CAPS, Take 1 capsule by mouth daily., Disp: , Rfl:  .  Probiotic CAPS, Take 1 capsule by mouth daily., Disp: , Rfl:  .  Vaginal Lubricant (REPLENS) GEL, Place 1 Applicatorful vaginally 2 (two) times a week. (Patient not taking: Reported on 03/27/2018), Disp: 35 g, Rfl: 11  Review of Systems  Constitutional: Negative.   HENT: Negative.        Pain on the right side of face near TMJ  Eyes: Negative.   Respiratory: Negative.   Cardiovascular: Negative.   Gastrointestinal: Negative.   Endocrine: Negative.   Genitourinary: Negative.   Musculoskeletal: Positive for back pain.  Skin: Negative.   Allergic/Immunologic: Negative.   Neurological: Positive for numbness (in right hand).  Hematological: Negative.   Psychiatric/Behavioral: Negative.     Social History   Tobacco Use  . Smoking status: Never Smoker  . Smokeless tobacco: Never Used  Substance Use Topics  . Alcohol use: No    Alcohol/week: 0.0 standard drinks      Objective:   BP 136/78 (BP Location: Right Arm, Patient Position: Sitting, Cuff Size: Large)   Pulse 61   Temp 97.8 F (36.6 C) (Oral)   Resp 16   Wt 146 lb (66.2 kg)   SpO2 99% Comment: room air  BMI 25.06 kg/m  Vitals:  03/27/18 0902  BP: 136/78  Pulse: 61  Resp: 16  Temp: 97.8 F (36.6 C)  TempSrc: Oral  SpO2: 99%  Weight: 146 lb (66.2 kg)     Physical Exam   General Appearance:    Alert, cooperative, no distress  Eyes:    PERRL, conjunctiva/corneas clear, EOM's intact       ENT:   Tender right TMJ, normal ear exam and oral exam.   Lungs:     Clear to auscultation bilaterally, respirations unlabored  Heart:    Regular rate and rhythm  Neurologic:   Awake, alert, oriented x 3. No apparent focal neurological           defect.  3+ symmetric bilateral patellar reflexes. +5 muscle strength bilaterally.   MS:   Tender bilateral para lumbar area. No gross deformities.         Assessment & Plan    1. Low back pain radiating to right leg She does have history of lumbar disc disease per MRI 2007.  - DG Lumbar Spine Complete; Future - predniSONE (DELTASONE) 10 MG tablet; 6 tablets for 2 days, then 5 for 2 days, then 4 for 2 days, then 3 for 2 days, then 2 for 2 days, then 1 daily until gone  Dispense: 60 tablet; Refill: 0   2. Arthralgia of right temporomandibular joint May see some relief from prednisone. Advised could try muscle relaxer or follow up with dental provider.       Lelon Huh, MD  [facility]

## 2018-03-30 ENCOUNTER — Encounter: Payer: Self-pay | Admitting: Family Medicine

## 2018-04-03 ENCOUNTER — Telehealth: Payer: Self-pay

## 2018-04-03 NOTE — Telephone Encounter (Signed)
Patient called after mychart sent to inform me that she had already gotten referral from her dentist

## 2018-04-03 NOTE — Telephone Encounter (Signed)
FYI

## 2018-04-03 NOTE — Telephone Encounter (Signed)
Patient reports that she sent Dr. 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 She is calling because she is no longer needing the referral because she found someone through her dentist.

## 2018-04-16 ENCOUNTER — Encounter: Payer: Self-pay | Admitting: Family Medicine

## 2018-04-16 DIAGNOSIS — M5126 Other intervertebral disc displacement, lumbar region: Secondary | ICD-10-CM

## 2018-04-16 DIAGNOSIS — M545 Low back pain, unspecified: Secondary | ICD-10-CM

## 2018-04-16 DIAGNOSIS — M26621 Arthralgia of right temporomandibular joint: Secondary | ICD-10-CM

## 2018-04-17 ENCOUNTER — Telehealth: Payer: Self-pay | Admitting: Family Medicine

## 2018-04-17 MED ORDER — PREDNISONE 10 MG PO TABS: ORAL_TABLET | ORAL | 0 refills | Status: AC

## 2018-04-17 NOTE — Telephone Encounter (Signed)
I sent new prescription to [facility] for prednisone refill, but patient said  [facility] told her it is too early to refill. Can you please call [facility] and advise them that prednisone can be dispensed today. Thanks.

## 2018-04-17 NOTE — Telephone Encounter (Signed)
Pt needing to know if she needs to continue with the original prednisone or start the new regiment that was called in.  Please call pt back at [phone] and advise asap.  Thanks, [employer]

## 2018-04-17 NOTE — Telephone Encounter (Signed)
I called and spoke with Pharmacist, who states that patient's insurance is the one who says it is too soon. They will not cover the most recent prescription because her last prescription for prednisone written on 03/27/2018 was for  30 days supply, and she should still have 9 tablets remaining, taking one tablet daily.

## 2018-04-18 ENCOUNTER — Encounter: Payer: Self-pay | Admitting: Family Medicine

## 2018-04-18 ENCOUNTER — Ambulatory Visit: Payer: BC Managed Care – PPO | Admitting: Family Medicine

## 2018-04-18 VITALS — BP 116/73 | HR 82 | Temp 97.9°F | Wt 146.0 lb

## 2018-04-18 DIAGNOSIS — J0110 Acute frontal sinusitis, unspecified: Secondary | ICD-10-CM

## 2018-04-18 MED ORDER — AMOXICILLIN 500 MG PO CAPS: 1000.0000 mg | ORAL_CAPSULE | Freq: Two times a day (BID) | ORAL | 0 refills | Status: AC

## 2018-04-18 NOTE — Progress Notes (Signed)
Patient: Lisa Skinner Female    DOB: [birth year]   58 y.o.   MRN: [id number] Visit Date: 04/18/2018  Today's Provider: Lelon Huh, MD   Chief Complaint  Patient presents with  . URI    Started two days ago.   Subjective:     URI   This is a new problem. The current episode started in the past 7 days. The problem has been gradually worsening. Associated symptoms include congestion, coughing, ear pain, headaches, a plugged ear sensation, rhinorrhea, sinus pain and swollen glands. Pertinent negatives include no nausea, neck pain, sneezing, sore throat or wheezing. Treatments tried: [place]. The treatment provided no relief.    Allergies  Allergen Reactions  . Codeine Other (See Comments)    Other reaction(s): Abdominal pain; BP spike; dizzy  . Allegra-D  [Fexofenadine-Pseudoephed Er]     Anxious.  . Cephalexin     GI Upset.  . Augmentin [Amoxicillin-Pot Clavulanate] Other (See Comments)    (GI upset)     Current Outpatient Medications:  .  estradiol (VIVELLE-DOT) 0.05 MG/24HR patch, Place 1 patch (0.05 mg total) onto the skin 2 (two) times a week., Disp: 8 patch, Rfl: 12 .  etodolac (LODINE) 400 MG tablet, take 1 tablet by mouth twice a day if needed, Disp: 30 tablet, Rfl: 4 .  fexofenadine (ALLEGRA) 180 MG tablet, Take 1 tablet by mouth daily., Disp: , Rfl:  .  fluticasone (FLONASE) 50 MCG/ACT nasal spray, instill 2 sprays into each nostril once daily, Disp: 16 g, Rfl: 6 .  levothyroxine (SYNTHROID, LEVOTHROID) 88 MCG tablet, TAKE 1 TABLET(88 MCG) BY MOUTH DAILY, Disp: 90 tablet, Rfl: 3 .  Multiple Vitamins-Minerals (MULTIVITAMIN ADULTS 50+) TABS, Take 1 tablet by mouth daily., Disp: , Rfl:  .  Omega-3 Fatty Acids (FISH OIL) 1360 MG CAPS, Take 1 capsule by mouth daily., Disp: , Rfl:  .  predniSONE (DELTASONE) 10 MG tablet, 3 tablets for 4 days, then 2 for 4 days, then 1 daily until gone, Disp: 24 tablet, Rfl: 0 .  Probiotic CAPS, Take 1 capsule by mouth daily.,  Disp: , Rfl:  .  Vaginal Lubricant (REPLENS) GEL, Place 1 Applicatorful vaginally 2 (two) times a week. (Patient not taking: Reported on 03/27/2018), Disp: 35 g, Rfl: 11  Review of Systems  Constitutional: Positive for chills, fatigue and fever. Negative for activity change, appetite change, diaphoresis and unexpected weight change.  HENT: Positive for congestion, ear pain, postnasal drip, rhinorrhea, sinus pressure and sinus pain. Negative for ear discharge, sneezing, sore throat, tinnitus, trouble swallowing and voice change.   Eyes: Negative.   Respiratory: Positive for cough. Negative for apnea, choking, chest tightness, shortness of breath, wheezing and stridor.   Gastrointestinal: Negative.  Negative for nausea.  Musculoskeletal: Positive for myalgias and neck stiffness. Negative for neck pain.  Neurological: Positive for headaches. Negative for dizziness and light-headedness.  Hematological: Positive for adenopathy.    Social History   Tobacco Use  . Smoking status: Never Smoker  . Smokeless tobacco: Never Used  Substance Use Topics  . Alcohol use: No    Alcohol/week: 0.0 standard drinks      Objective:   BP 116/73 (BP Location: Right Arm, Patient Position: Sitting, Cuff Size: Normal)   Pulse 82   Temp 97.9 F (36.6 C) (Oral)   Wt 146 lb (66.2 kg)   SpO2 98%   BMI 25.06 kg/m    Physical Exam  General Appearance:    Alert, cooperative,  no distress  HENT:   bilateral TM normal without fluid or infection, neck without nodes, pharynx erythematous without exudate, frontal sinus tender and nasal mucosa congested  Eyes:    PERRL, conjunctiva/corneas clear, EOM's intact       Lungs:     Clear to auscultation bilaterally, respirations unlabored  Heart:    Regular rate and rhythm  Neurologic:   Awake, alert, oriented x 3. No apparent focal neurological           defect.          Assessment & Plan    1. Acute non-recurrent frontal sinusitis  - amoxicillin (AMOXIL) 500 MG  capsule; Take 2 capsules (1,000 mg total) by mouth 2 (two) times daily for 10 days.  Dispense: 40 capsule; Refill: 0  Call if symptoms change or if not rapidly improving.     Lelon Huh, MD  [facility]

## 2018-04-18 NOTE — Patient Instructions (Signed)
.   Please review the attached list of medications and notify my office if there are any errors.   . Please bring all of your medications to every appointment so we can make sure that our medication list is the same as yours.   

## 2018-04-20 ENCOUNTER — Telehealth: Payer: Self-pay | Admitting: Family Medicine

## 2018-04-20 NOTE — Telephone Encounter (Signed)
Pt needing to know if it's ok to take all these together or how Dr. Caryn Section advises.  amoxicillin - Wed- 2 times a day Prednisone - has not started   Please call pt back at  [phone] to advise.  Thanks, [employer]

## 2018-04-20 NOTE — Telephone Encounter (Signed)
Pt advised.   Thanks,   -Laura  

## 2018-04-20 NOTE — Telephone Encounter (Signed)
They can be taken together.

## 2018-06-15 ENCOUNTER — Encounter: Payer: Self-pay | Admitting: Family Medicine

## 2018-07-16 ENCOUNTER — Encounter: Payer: Self-pay | Admitting: Family Medicine

## 2018-07-16 ENCOUNTER — Telehealth: Payer: Self-pay

## 2018-07-16 DIAGNOSIS — M545 Low back pain, unspecified: Secondary | ICD-10-CM

## 2018-07-16 DIAGNOSIS — M5126 Other intervertebral disc displacement, lumbar region: Secondary | ICD-10-CM

## 2018-07-16 NOTE — Telephone Encounter (Signed)
Patient called back saying that she does need a referral from her PCP since she would be a new patient.  She also sent a Mychart message with the following details:   Hi Dr. Caryn Section, I spoke with your nurse this morning (May 11) and she advised you will be back in on Thursday, May 14. I just received a call back from the [facility] and since I've never been seen there before, they will require a referral. They are requesting my progress notes, POC, etc be faxed to them at [phone]. They were able to find my films through [employer] and will request those from [facility]. My back just isn't getting better and I'm afraid I might be looking at surgery. If so, I want it done at [facility]. I called your office a moment ago to update your nurse, but was told the phone system was down and to call back later today.    Thank you!

## 2018-07-16 NOTE — Telephone Encounter (Signed)
Patient called requesting a referral to [facility] Neurosurgery clinic [phone] Dr. Herbert Pun. She states the back pain that she was last seen for 03/2018 is starting to worsen and she wants to see a specialist. She says their office doesn't require a referral, but she would like Dr. Caryn Section to be involved and send the office notes and imaging reports. Please advise on referral . Patient is aware that Dr. Caryn Section is out of the office until Thursday.

## 2018-07-17 NOTE — Telephone Encounter (Signed)
Have entered order for neurosurgery referral. Please see patient requests below.

## 2018-07-25 ENCOUNTER — Encounter: Payer: Self-pay | Admitting: Family Medicine

## 2018-07-27 ENCOUNTER — Encounter: Payer: Self-pay | Admitting: Physician Assistant

## 2018-07-27 ENCOUNTER — Other Ambulatory Visit: Payer: Self-pay

## 2018-07-27 ENCOUNTER — Other Ambulatory Visit (HOSPITAL_COMMUNITY)
Admission: RE | Admit: 2018-07-27 | Discharge: 2018-07-27 | Disposition: A | Discharge status: Home / Self Care | Payer: BC Managed Care – PPO | Source: Ambulatory Visit | Attending: Physician Assistant | Admitting: Physician Assistant

## 2018-07-27 ENCOUNTER — Ambulatory Visit: Payer: BC Managed Care – PPO | Admitting: Physician Assistant

## 2018-07-27 VITALS — BP 145/82 | HR 67 | Temp 98.0°F | Wt 155.2 lb

## 2018-07-27 DIAGNOSIS — R300 Dysuria: Secondary | ICD-10-CM | POA: Diagnosis present

## 2018-07-27 LAB — POCT URINALYSIS DIPSTICK
Glucose, UA: NEGATIVE
Ketones, UA: NEGATIVE
Leukocytes, UA: NEGATIVE
Nitrite, UA: NEGATIVE
Protein, UA: NEGATIVE
Spec Grav, UA: 1.015 (ref 1.010–1.025)
Urobilinogen, UA: 0.2 E.U./dL
pH, UA: 7 (ref 5.0–8.0)

## 2018-07-27 MED ORDER — SULFAMETHOXAZOLE-TRIMETHOPRIM 800-160 MG PO TABS: 1.0000 | ORAL_TABLET | Freq: Two times a day (BID) | ORAL | 0 refills | Status: AC

## 2018-07-27 NOTE — Patient Instructions (Signed)

## 2018-07-27 NOTE — Progress Notes (Signed)
Patient: Lisa Skinner Female    DOB: [birth year]   58 y.o.   MRN: [id number] Visit Date: 07/27/2018  Today's Provider: Trinna Post, PA-C   Chief Complaint  Patient presents with  . Dysuria   Subjective:    Reports one day of vaginal vibrations since last night. Denies dysuria. Denies hematuria. Denies fevers, chills, nausea, vomiting, flank pain. Denies vaginal discharge. Denies new sexual partners. Denies abdominal pain but has some pelvic discomfort. Denies flank pain.   Dysuria   This is a new problem. The current episode started yesterday. The problem occurs every urination. The problem has been unchanged. The quality of the pain is described as aching (pressure). There has been no fever. Associated symptoms include frequency and urgency. Associated symptoms comments: Itching . She has tried nothing for the symptoms.   Wt Readings from Last 3 Encounters:  07/27/18 155 lb 3.2 oz (70.4 kg)  06/13/18 145 lb (65.8 kg)  04/18/18 146 lb (66.2 kg)     Allergies  Allergen Reactions  . Codeine Other (See Comments)    Other reaction(s): Abdominal pain; BP spike; dizzy  . Allegra-D  [Fexofenadine-Pseudoephed Er]     Anxious.  . Cephalexin     GI Upset.  . Augmentin [Amoxicillin-Pot Clavulanate] Other (See Comments)    (GI upset)     Current Outpatient Medications:  .  estradiol (VIVELLE-DOT) 0.05 MG/24HR patch, Place 1 patch (0.05 mg total) onto the skin 2 (two) times a week., Disp: 8 patch, Rfl: 12 .  etodolac (LODINE) 400 MG tablet, take 1 tablet by mouth twice a day if needed, Disp: 30 tablet, Rfl: 4 .  fexofenadine (ALLEGRA) 180 MG tablet, Take 1 tablet by mouth daily., Disp: , Rfl:  .  fluticasone (FLONASE) 50 MCG/ACT nasal spray, instill 2 sprays into each nostril once daily, Disp: 16 g, Rfl: 6 .  levothyroxine (SYNTHROID, LEVOTHROID) 88 MCG tablet, TAKE 1 TABLET(88 MCG) BY MOUTH DAILY, Disp: 90 tablet, Rfl: 3 .  Multiple Vitamins-Minerals (MULTIVITAMIN ADULTS  50+) TABS, Take 1 tablet by mouth daily., Disp: , Rfl:  .  Omega-3 Fatty Acids (FISH OIL) 1360 MG CAPS, Take 1 capsule by mouth daily., Disp: , Rfl:  .  Probiotic CAPS, Take 1 capsule by mouth daily., Disp: , Rfl:  .  Vaginal Lubricant (REPLENS) GEL, Place 1 Applicatorful vaginally 2 (two) times a week. (Patient not taking: Reported on 03/27/2018), Disp: 35 g, Rfl: 11  Review of Systems  Constitutional: Negative.   Respiratory: Negative.   Cardiovascular: Negative.   Genitourinary: Positive for dysuria, frequency and urgency.  Musculoskeletal: Negative.     Social History   Tobacco Use  . Smoking status: Never Smoker  . Smokeless tobacco: Never Used  Substance Use Topics  . Alcohol use: No    Alcohol/week: 0.0 standard drinks      Objective:   BP (!) 145/82 (BP Location: Left Arm, Patient Position: Sitting, Cuff Size: Normal)   Pulse 67   Temp 98 F (36.7 C) (Oral)   Wt 155 lb 3.2 oz (70.4 kg)   SpO2 97%   BMI 26.64 kg/m  Vitals:   07/27/18 1510  BP: (!) 145/82  Pulse: 67  Temp: 98 F (36.7 C)  TempSrc: Oral  SpO2: 97%  Weight: 155 lb 3.2 oz (70.4 kg)     Physical Exam Constitutional:      Appearance: Normal appearance.  Cardiovascular:     Rate and Rhythm: Normal rate and  regular rhythm.     Heart sounds: Normal heart sounds.  Pulmonary:     Effort: Pulmonary effort is normal.     Breath sounds: Normal breath sounds.  Abdominal:     General: Abdomen is flat. Bowel sounds are normal.     Palpations: Abdomen is soft.  Skin:    General: Skin is warm and dry.  Neurological:     Mental Status: She is alert and oriented to person, place, and time. Mental status is at baseline.  Psychiatric:        Mood and Affect: Mood normal.        Behavior: Behavior normal.         Assessment & Plan    1. Dysuria  I think she is OK to observe her symptoms for now and wait for urine culture. Will prescribe bactrim as below as it is a holiday weekend and we will not  be in clinic again until Tuesday. She may take this if she has worsening of symptoms. I will also have her self collect vaginal swab to further evaluate symptoms.   - POCT Urinalysis Dipstick - sulfamethoxazole-trimethoprim (BACTRIM DS) 800-160 MG tablet; Take 1 tablet by mouth 2 (two) times daily for 3 days.  Dispense: 6 tablet; Refill: 0 - Cervicovaginal ancillary only - CULTURE, URINE COMPREHENSIVE  The entirety of the information documented in the History of Present Illness, Review of Systems and Physical Exam were personally obtained by me. Portions of this information were initially documented by Idelle Jo, CMA and reviewed by me for thoroughness and accuracy.   F/u PRN       Trinna Post, PA-C  [facility]

## 2018-08-01 LAB — CERVICOVAGINAL ANCILLARY ONLY
Bacterial vaginitis: NEGATIVE
Candida vaginitis: NEGATIVE
Chlamydia: NEGATIVE
Neisseria Gonorrhea: NEGATIVE
Trichomonas: NEGATIVE

## 2018-08-03 LAB — CULTURE, URINE COMPREHENSIVE

## 2018-08-07 ENCOUNTER — Telehealth: Payer: Self-pay | Admitting: Family Medicine

## 2018-08-07 ENCOUNTER — Telehealth: Payer: Self-pay

## 2018-08-07 MED ORDER — NITROFURANTOIN MONOHYD MACRO 100 MG PO CAPS: 100.0000 mg | ORAL_CAPSULE | Freq: Two times a day (BID) | ORAL | 0 refills | Status: DC

## 2018-08-07 MED ORDER — CIPROFLOXACIN HCL 250 MG PO TABS: 250.0000 mg | ORAL_TABLET | Freq: Two times a day (BID) | ORAL | 0 refills | Status: AC

## 2018-08-07 NOTE — Telephone Encounter (Signed)
Pt wants to know if antibiotic can be changed from the Cipro to something else  CB# [id number]  thanks teri

## 2018-08-07 NOTE — Telephone Encounter (Signed)
It can be macrobid 100 mg BID x 5 days or levofloxacin.

## 2018-08-07 NOTE — Telephone Encounter (Signed)
This patient was last seen and treated by [facility]. Please advise.

## 2018-08-07 NOTE — Telephone Encounter (Signed)
Patient will take macrobid

## 2018-08-08 ENCOUNTER — Encounter: Payer: Self-pay | Admitting: Physician Assistant

## 2018-08-12 ENCOUNTER — Other Ambulatory Visit: Payer: Self-pay | Admitting: Family Medicine

## 2018-08-13 NOTE — Telephone Encounter (Signed)
rx sent to pharmacy

## 2018-09-04 ENCOUNTER — Other Ambulatory Visit: Payer: Self-pay | Admitting: Family Medicine

## 2018-09-04 DIAGNOSIS — M5126 Other intervertebral disc displacement, lumbar region: Secondary | ICD-10-CM

## 2018-09-11 ENCOUNTER — Encounter: Payer: Self-pay | Admitting: Obstetrics & Gynecology

## 2018-09-11 ENCOUNTER — Other Ambulatory Visit: Payer: Self-pay

## 2018-09-11 ENCOUNTER — Ambulatory Visit (INDEPENDENT_AMBULATORY_CARE_PROVIDER_SITE_OTHER): Payer: BC Managed Care – PPO | Admitting: Obstetrics & Gynecology

## 2018-09-11 VITALS — BP 120/80 | Ht 64.0 in | Wt 157.0 lb

## 2018-09-11 DIAGNOSIS — Z01419 Encounter for gynecological examination (general) (routine) without abnormal findings: Secondary | ICD-10-CM

## 2018-09-11 DIAGNOSIS — Z1211 Encounter for screening for malignant neoplasm of colon: Secondary | ICD-10-CM

## 2018-09-11 DIAGNOSIS — Z1239 Encounter for other screening for malignant neoplasm of breast: Secondary | ICD-10-CM

## 2018-09-11 DIAGNOSIS — R232 Flushing: Secondary | ICD-10-CM

## 2018-09-11 MED ORDER — ESTRADIOL 0.05 MG/24HR TD PTTW: 1.0000 | MEDICATED_PATCH | TRANSDERMAL | 12 refills | Status: DC

## 2018-09-11 NOTE — Progress Notes (Signed)
HPI:      [name] is a 58 y.o. G0P0000 who LMP was in the past, she presents today for her annual examination.  The patient has no complaints today. The patient is sexually active. Herlast pap: approximate date 2019 and was normal and last mammogram: approximate date 2019 and was normal.  The patient does perform self breast exams.  There is no notable family history of breast or ovarian cancer in her family. The patient is taking hormone replacement therapy. Patient denies post-menopausal vaginal bleeding.   The patient has regular exercise: yes. The patient denies current symptoms of depression.    GYN Hx: Last Colonoscopy:4 years ago. Normal.  Last DEXA: never ago.    PMHx: Past Medical History:  Diagnosis Date  . Allergic rhinitis   . Ankle sprain 08/18/2016   right  . History of mammogram 09/15/2015   neg  . History of mumps   . History of shingles   . Thyroid disease    hypothyroid   Past Surgical History:  Procedure Laterality Date  . ABDOMINAL HYSTERECTOMY  2003   with BSO multiple cyst; BVD  . DILATION AND CURETTAGE OF UTERUS  07/17/1992  . KNEE ARTHROSCOPY  90S   LEFT  . LASIK  05/20/1994   Family History  Problem Relation Age of Onset  . Cancer Mother 25        UTERINE  . Hypertension Mother   . COPD Mother   . Congestive Heart Failure Mother   . Alcohol abuse Father   . Hypertension Father   . Congestive Heart Failure Father   . Pneumonia Father        died from aspiration pneumonia  . Melanoma Sister 40       recurrence 2013   Social History   Tobacco Use  . Smoking status: Never Smoker  . Smokeless tobacco: Never Used  Substance Use Topics  . Alcohol use: No    Alcohol/week: 0.0 standard drinks  . Drug use: No    Current Outpatient Medications:  .  [START ON 09/13/2018] estradiol (VIVELLE-DOT) 0.05 MG/24HR patch, Place 1 patch (0.05 mg total) onto the skin 2 (two) times a week., Disp: 8 patch, Rfl: 12 .  etodolac (LODINE) 400 MG  tablet, TAKE 1 TABLET BY MOUTH TWICE DAILY AS NEEDED, Disp: 30 tablet, Rfl: 4 .  fexofenadine (ALLEGRA) 180 MG tablet, Take 1 tablet by mouth daily., Disp: , Rfl:  .  fluticasone (FLONASE) 50 MCG/ACT nasal spray, instill 2 sprays into each nostril once daily, Disp: 16 g, Rfl: 6 .  levothyroxine (SYNTHROID) 88 MCG tablet, TAKE 1 TABLET(88 MCG) BY MOUTH DAILY, Disp: 90 tablet, Rfl: 3 .  Multiple Vitamins-Minerals (MULTIVITAMIN ADULTS 50+) TABS, Take 1 tablet by mouth daily., Disp: , Rfl:  .  Omega-3 Fatty Acids (FISH OIL) 1360 MG CAPS, Take 1 capsule by mouth daily., Disp: , Rfl:  .  Probiotic CAPS, Take 1 capsule by mouth daily., Disp: , Rfl:  Allergies: Codeine, Allegra-d  [fexofenadine-pseudoephed er], Cephalexin, and Augmentin [amoxicillin-pot clavulanate]  Review of Systems  Constitutional: Negative for chills, fever and malaise/fatigue.  HENT: Negative for congestion, sinus pain and sore throat.   Eyes: Negative for blurred vision and pain.  Respiratory: Negative for cough and wheezing.   Cardiovascular: Negative for chest pain and leg swelling.  Gastrointestinal: Negative for abdominal pain, constipation, diarrhea, heartburn, nausea and vomiting.  Genitourinary: Negative for dysuria, frequency, hematuria and urgency.  Musculoskeletal: Negative for back pain, joint  pain, myalgias and neck pain.  Skin: Negative for itching and rash.  Neurological: Negative for dizziness, tremors and weakness.  Endo/Heme/Allergies: Does not bruise/bleed easily.  Psychiatric/Behavioral: Negative for depression. The patient is not nervous/anxious and does not have insomnia.     Objective: BP 120/80   Ht 5\' 4"  (1.626 m)   Wt 157 lb (71.2 kg)   BMI 26.95 kg/m   Filed Weights   09/11/18 0803  Weight: 157 lb (71.2 kg)   Body mass index is 26.95 kg/m. Physical Exam Constitutional:      General: She is not in acute distress.    Appearance: She is well-developed.  Genitourinary:     Pelvic exam was  performed with patient supine.     Vagina and rectum normal.     No lesions in the vagina.     No vaginal bleeding.     No right or left adnexal mass present.     Right adnexa not tender.     Left adnexa not tender.     Genitourinary Comments: Absent Uterus Absent cervix Vaginal cuff well healed  HENT:     Head: Normocephalic and atraumatic. No laceration.     Right Ear: Hearing normal.     Left Ear: Hearing normal.     Mouth/Throat:     Pharynx: Uvula midline.  Eyes:     Pupils: Pupils are equal, round, and reactive to light.  Neck:     Musculoskeletal: Normal range of motion and neck supple.     Thyroid: No thyromegaly.  Cardiovascular:     Rate and Rhythm: Normal rate and regular rhythm.     Heart sounds: No murmur. No friction rub. No gallop.   Pulmonary:     Effort: Pulmonary effort is normal. No respiratory distress.     Breath sounds: Normal breath sounds. No wheezing.  Chest:     Breasts:        Right: No mass, skin change or tenderness.        Left: No mass, skin change or tenderness.  Abdominal:     General: Bowel sounds are normal. There is no distension.     Palpations: Abdomen is soft.     Tenderness: There is no abdominal tenderness. There is no rebound.  Musculoskeletal: Normal range of motion.  Neurological:     Mental Status: She is alert and oriented to person, place, and time.     Cranial Nerves: No cranial nerve deficit.  Skin:    General: Skin is warm and dry.  Psychiatric:        Judgment: Judgment normal.  Vitals signs reviewed.     Assessment: Annual Exam 1. Women's annual routine gynecological examination   2. Screen for colon cancer   3. Vasomotor flushing   4. Screening for breast cancer     Plan:            1.  Cervical Screening-  Pap smear schedule reviewed with patient  2. Breast screening- Exam annually and mammogram scheduled  3. Colonoscopy every 10 years, Hemoccult testing after age 65  4. Labs managed by PCP  5.  Counseling for hormonal therapy: no change in therapy today Cont Patch 0.05mg   Twice weekly Maintain dose this year, consider taper dose next year  6. Vaginal atrophy, start Replens (did not take last year) Alternatives discussed  7. Bladder urgency, occas leakage Kegels.  Monitor sx's. Meds for OAB and procedures for GSI discussed  8. FRAX - FRAX score for assessing the 10 year probability for fracture calculated and discussed today.  Based on age and score today, DEXA is not currently scheduled.    F/U  Return in about 1 year (around 09/11/2019) for Annual.  Barnett Applebaum, MD, [facility], [facility] 09/11/2018  8:33 AM

## 2018-09-11 NOTE — Patient Instructions (Signed)
PAP every 5 years Mammogram every year    Call to schedule at [facility] Colonoscopy every 10 years    Stool cards given today Labs yearly (with PCP)

## 2018-09-12 ENCOUNTER — Encounter: Payer: Self-pay | Admitting: Family Medicine

## 2018-09-15 LAB — SPECIMEN STATUS REPORT

## 2018-09-15 LAB — FECAL OCCULT BLOOD, IMMUNOCHEMICAL: Fecal Occult Bld: NEGATIVE

## 2018-09-19 ENCOUNTER — Encounter: Payer: Self-pay | Admitting: Obstetrics & Gynecology

## 2018-09-21 NOTE — Telephone Encounter (Signed)
Can you order bone density for pt

## 2018-09-24 ENCOUNTER — Encounter: Payer: Self-pay | Admitting: Obstetrics & Gynecology

## 2018-09-24 ENCOUNTER — Telehealth: Payer: Self-pay | Admitting: Family Medicine

## 2018-09-24 ENCOUNTER — Other Ambulatory Visit: Payer: Self-pay | Admitting: Obstetrics & Gynecology

## 2018-09-24 ENCOUNTER — Encounter: Payer: Self-pay | Admitting: Family Medicine

## 2018-09-24 DIAGNOSIS — J069 Acute upper respiratory infection, unspecified: Secondary | ICD-10-CM

## 2018-09-24 DIAGNOSIS — Z13820 Encounter for screening for osteoporosis: Secondary | ICD-10-CM

## 2018-09-24 MED ORDER — AMOXICILLIN 500 MG PO TABS: 500.0000 mg | ORAL_TABLET | Freq: Two times a day (BID) | ORAL | 0 refills | Status: DC

## 2018-09-24 NOTE — Telephone Encounter (Signed)
Order placed. Do we then arrange or does patient?   Check with Izora Gala like a referral.

## 2018-09-24 NOTE — Telephone Encounter (Signed)
Please advise 

## 2018-09-24 NOTE — Telephone Encounter (Signed)
Patient advised and agrees with treatment plan. Dr. Caryn Section, which dx code should I use to associate with this prescription? I know the pharmacy always calls back to the office wanting a dx code for this antibiotic. Pharmacy: [facility]

## 2018-09-24 NOTE — Telephone Encounter (Signed)
Pt called saying she had a stomach virus last week.   She was covid tested Thursday it came back negative Friday.  She now has a sore throat and ear pain.  Lump feeling in her throat  Please advise  [phone]  Thanks Con Memos

## 2018-09-24 NOTE — Telephone Encounter (Signed)
If she likes we can send prescription for antibiotic to cover for bacterial infections. Can send prescription for amoxicillin 500 2 BID for 7 days. If not feeling better in 3-4 days she may need to have covid test redone. Either way she should stay at home until she is better.

## 2018-09-25 NOTE — Telephone Encounter (Signed)
Please advise 

## 2018-10-05 ENCOUNTER — Encounter: Payer: Self-pay | Admitting: Obstetrics & Gynecology

## 2018-10-11 ENCOUNTER — Other Ambulatory Visit: Payer: Self-pay | Admitting: Obstetrics & Gynecology

## 2018-10-26 ENCOUNTER — Other Ambulatory Visit: Payer: Self-pay | Admitting: Obstetrics & Gynecology

## 2018-11-20 ENCOUNTER — Ambulatory Visit: Payer: BC Managed Care – PPO | Admitting: Family Medicine

## 2018-11-20 ENCOUNTER — Other Ambulatory Visit: Payer: Self-pay

## 2018-11-20 ENCOUNTER — Encounter: Payer: Self-pay | Admitting: Family Medicine

## 2018-11-20 VITALS — BP 118/74 | HR 63 | Temp 98.2°F | Resp 15 | Wt 163.0 lb

## 2018-11-20 DIAGNOSIS — Z136 Encounter for screening for cardiovascular disorders: Secondary | ICD-10-CM | POA: Diagnosis not present

## 2018-11-20 DIAGNOSIS — E039 Hypothyroidism, unspecified: Secondary | ICD-10-CM

## 2018-11-20 DIAGNOSIS — M48061 Spinal stenosis, lumbar region without neurogenic claudication: Secondary | ICD-10-CM | POA: Diagnosis not present

## 2018-11-20 DIAGNOSIS — M5416 Radiculopathy, lumbar region: Secondary | ICD-10-CM

## 2018-11-20 MED ORDER — PREDNISONE 20 MG PO TABS: 20.0000 mg | ORAL_TABLET | Freq: Every day | ORAL | 0 refills | Status: AC

## 2018-11-20 NOTE — Patient Instructions (Signed)
.   Please review the attached list of medications and notify my office if there are any errors.   . Please bring all of your medications to every appointment so we can make sure that our medication list is the same as yours.   . It is especially important to get the annual flu vaccine this year. If you haven't had it already, please go to your pharmacy or call the office as soon as possible to schedule you flu shot.  

## 2018-11-20 NOTE — Progress Notes (Signed)
Patient: Lisa Skinner Female    DOB: 1960-12-22   58 y.o.   MRN: [id number] Visit Date: 11/20/2018  Today's Provider: Lelon Huh, MD   Chief Complaint  Patient presents with  . Back Pain   Subjective:     HPI Patient comes in office today to discuss need for referral to Orthopedist. Patient has a history of Lumbar Radiculopathy and Lumbar DDD. Patient states that she has has back problems dating back to 2007 affecting her L4 & L5. Patient states that she has tried prescription medication, PT and has spoken with neurosurgeon in past about surgery, patient states at time she was told in past that she was not a candidate  for surgery. Patient reports that she has numbness and tingling radiating lateral side of her right leg and has progressed into her foot. Patient reports that she has been receiving spinal injections with recent injection being 10/26/18 all of which only provide temporary relief, but overall pain has been getting progressively worse over the last year.   She had MRI in May Result Impression  - Lumbar spondylosis. At L4-L5, a disc protrusion contributes to severe left lateral recess stenosis with contact upon the descending left L5 nerve root. Right lateral recess narrowing and moderate central canal stenosis also present at this level.  - Mild L5-S1 lateral recess narrowing with contact upon bilateral descending S1 nerve roots. - Sites of mild and moderate neural foraminal narrowing as detailed.   Patient states that she has fears of possible nerve damage and is requesting referral to  Dr. Aggie Cosier at [facility].   Allergies  Allergen Reactions  . Codeine Other (See Comments)    Other reaction(s): Abdominal pain; BP spike; dizzy  . Allegra-D  [Fexofenadine-Pseudoephed Er]     Anxious.  . Cephalexin     GI Upset.  . Augmentin [Amoxicillin-Pot Clavulanate] Other (See Comments)    (GI upset)     Current Outpatient Medications:  .  estradiol  (VIVELLE-DOT) 0.05 MG/24HR patch, Place 1 patch (0.05 mg total) onto the skin 2 (two) times a week., Disp: 8 patch, Rfl: 12 .  etodolac (LODINE) 400 MG tablet, TAKE 1 TABLET BY MOUTH TWICE DAILY AS NEEDED, Disp: 30 tablet, Rfl: 4 .  fexofenadine (ALLEGRA) 180 MG tablet, Take 1 tablet by mouth daily., Disp: , Rfl:  .  fluticasone (FLONASE) 50 MCG/ACT nasal spray, instill 2 sprays into each nostril once daily, Disp: 16 g, Rfl: 6 .  levothyroxine (SYNTHROID) 88 MCG tablet, TAKE 1 TABLET(88 MCG) BY MOUTH DAILY, Disp: 90 tablet, Rfl: 3 .  Multiple Vitamins-Minerals (MULTIVITAMIN ADULTS 50+) TABS, Take 1 tablet by mouth daily., Disp: , Rfl:  .  Omega-3 Fatty Acids (FISH OIL) 1360 MG CAPS, Take 1 capsule by mouth daily., Disp: , Rfl:  .  Probiotic CAPS, Take 1 capsule by mouth daily., Disp: , Rfl:   Review of Systems  Constitutional: Negative.   HENT: Negative.   Cardiovascular: Negative.   Musculoskeletal: Positive for back pain.  Neurological: Positive for weakness and numbness.    Social History   Tobacco Use  . Smoking status: Never Smoker  . Smokeless tobacco: Never Used  Substance Use Topics  . Alcohol use: No    Alcohol/week: 0.0 standard drinks      Objective:   BP 118/74   Pulse 63   Temp 98.2 F (36.8 C) (Oral)   Resp 15   Wt 163 lb (73.9 kg)  SpO2 99%   BMI 27.98 kg/m  Vitals:   11/20/18 1607  BP: 118/74  Pulse: 63  Resp: 15  Temp: 98.2 F (36.8 C)  TempSrc: Oral  SpO2: 99%  Weight: 163 lb (73.9 kg)  Body mass index is 27.98 kg/m.   Physical Exam  General appearance: alert, well developed, well nourished, cooperative and in no distress Head: Normocephalic, without obvious abnormality, atraumatic Respiratory: Respirations even and unlabored, normal respiratory rate Extremities: All extremities are intact.  Skin: Skin color, texture, turgor normal. No rashes seen  Psych: Appropriate mood and affect. Neurologic: Mental status: Alert, oriented to person,  place, and time, thought content appropriate.       Assessment & Plan    1. Spinal stenosis of lumbar region without neurogenic claudication She has failed multiple conservative and invasive modalities. She does not tolerate high dose prednisone but is interested in trying low dose for some temporary relief.  - Ambulatory referral to Orthopedic Surgery - Comprehensive metabolic panel - predniSONE (DELTASONE) 20 MG tablet; Take 1 tablet (20 mg total) by mouth daily with breakfast for 10 days.  Dispense: 10 tablet; Refill: 0  2. Lumbar radiculopathy  - Ambulatory referral to Orthopedic Surgery - Comprehensive metabolic panel - predniSONE (DELTASONE) 20 MG tablet; Take 1 tablet (20 mg total) by mouth daily with breakfast for 10 days.  Dispense: 10 tablet; Refill: 0  3. Hypothyroidism, unspecified type Lab Results  Component Value Date   TSH 1.280 02/26/2016    - Comprehensive metabolic panel - TSH - T4, free  4. Encounter for special screening examination for cardiovascular disorder  - Comprehensive metabolic panel - Lipid panel - Basic metabolic panel  The entirety of the information documented in the History of Present Illness, Review of Systems and Physical Exam were personally obtained by me. Portions of this information were initially documented by Minette Headland, CMA and reviewed by me for thoroughness and accuracy.     Lelon Huh, MD  [facility]

## 2018-11-22 LAB — COMPREHENSIVE METABOLIC PANEL
ALT: 16 IU/L (ref 0–32)
AST: 24 IU/L (ref 0–40)
Albumin/Globulin Ratio: 1.6 (ref 1.2–2.2)
Albumin: 4.1 g/dL (ref 3.8–4.9)
Alkaline Phosphatase: 50 IU/L (ref 39–117)
BUN/Creatinine Ratio: 18 (ref 9–23)
BUN: 16 mg/dL (ref 6–24)
Bilirubin Total: 1.4 mg/dL — ABNORMAL HIGH (ref 0.0–1.2)
CO2: 24 mmol/L (ref 20–29)
Calcium: 9.1 mg/dL (ref 8.7–10.2)
Chloride: 104 mmol/L (ref 96–106)
Creatinine, Ser: 0.91 mg/dL (ref 0.57–1.00)
GFR calc Af Amer: 81 mL/min/{1.73_m2} (ref >59)
GFR calc non Af Amer: 70 mL/min/{1.73_m2} (ref >59)
Globulin, Total: 2.6 g/dL (ref 1.5–4.5)
Glucose: 88 mg/dL (ref 65–99)
Potassium: 4.8 mmol/L (ref 3.5–5.2)
Sodium: 141 mmol/L (ref 134–144)
Total Protein: 6.7 g/dL (ref 6.0–8.5)

## 2018-11-22 LAB — LIPID PANEL
Chol/HDL Ratio: 2.1 ratio (ref 0.0–4.4)
Cholesterol, Total: 178 mg/dL (ref 100–199)
HDL: 84 mg/dL (ref >39)
LDL Chol Calc (NIH): 84 mg/dL (ref 0–99)
Triglycerides: 51 mg/dL (ref 0–149)
VLDL Cholesterol Cal: 10 mg/dL (ref 5–40)

## 2018-11-22 LAB — TSH: TSH: 0.839 u[IU]/mL (ref 0.450–4.500)

## 2018-11-22 LAB — T4, FREE: Free T4: 1.79 ng/dL — ABNORMAL HIGH (ref 0.82–1.77)

## 2018-12-02 ENCOUNTER — Other Ambulatory Visit: Payer: Self-pay | Admitting: Obstetrics & Gynecology

## 2018-12-02 DIAGNOSIS — R232 Flushing: Secondary | ICD-10-CM

## 2018-12-13 ENCOUNTER — Other Ambulatory Visit: Payer: Self-pay | Admitting: Obstetrics & Gynecology

## 2018-12-21 ENCOUNTER — Encounter: Payer: Self-pay | Admitting: Family Medicine

## 2018-12-21 NOTE — Progress Notes (Signed)
Patient: Lisa Skinner Female    DOB: June 27, 1960   58 y.o.   MRN: [id number] Visit Date: 12/21/2018  Today's Provider: Lelon Huh, MD   Chief Complaint  Patient presents with  . Sinus Problem   Subjective:    Virtual Visit via Telephone Note  I connected with Lisa Skinner on 12/31/18 at  8:00 AM EDT by telephone and verified that I am speaking with the correct person using two identifiers.  Location: Patient: Home Provider: [facility]   I discussed the limitations, risks, security and privacy concerns of performing an evaluation and management service by telephone and the availability of in person appointments. I also discussed with the patient that there may be a patient responsible charge related to this service. The patient expressed understanding and agreed to proceed.  Sinus Problem This is a new problem. Episode onset: 1 week ago. The problem has been gradually worsening since onset. Associated symptoms include congestion, coughing (mild), ear pain (left ear), headaches, a hoarse voice, sinus pressure, sneezing and swollen glands (under arms). Pertinent negatives include no chills, diaphoresis, shortness of breath or sore throat. (Sinus pain) Past treatments include acetaminophen (Psudephedrine and Flonase). The treatment provided mild relief.    Allergies  Allergen Reactions  . Codeine Other (See Comments)    Other reaction(s): Abdominal pain; BP spike; dizzy  . Allegra-D  [Fexofenadine-Pseudoephed Er]     Anxious.  . Cephalexin     GI Upset.  . Augmentin [Amoxicillin-Pot Clavulanate] Other (See Comments)    (GI upset)     Current Outpatient Medications:  .  estradiol (VIVELLE-DOT) 0.05 MG/24HR patch, APPLY 1 PATCH(0.05 MG) EXTERNALLY TO THE SKIN 2 TIMES A WEEK, Disp: 8 patch, Rfl: 12 .  etodolac (LODINE) 400 MG tablet, TAKE 1 TABLET BY MOUTH TWICE DAILY AS NEEDED, Disp: 30 tablet, Rfl: 4 .  fexofenadine (ALLEGRA) 180 MG tablet, Take 1  tablet by mouth daily., Disp: , Rfl:  .  fluticasone (FLONASE) 50 MCG/ACT nasal spray, instill 2 sprays into each nostril once daily, Disp: 16 g, Rfl: 6 .  levothyroxine (SYNTHROID) 88 MCG tablet, TAKE 1 TABLET(88 MCG) BY MOUTH DAILY, Disp: 90 tablet, Rfl: 3 .  Multiple Vitamins-Minerals (MULTIVITAMIN ADULTS 50+) TABS, Take 1 tablet by mouth daily., Disp: , Rfl:  .  Omega-3 Fatty Acids (FISH OIL) 1360 MG CAPS, Take 1 capsule by mouth daily., Disp: , Rfl:  .  Probiotic CAPS, Take 1 capsule by mouth daily., Disp: , Rfl:   Review of Systems  Constitutional: Negative for appetite change, chills, diaphoresis, fatigue and fever.  HENT: Positive for congestion, ear pain (left ear), hoarse voice, postnasal drip, rhinorrhea, sinus pressure, sinus pain and sneezing. Negative for sore throat.   Respiratory: Positive for cough (mild). Negative for chest tightness and shortness of breath.   Cardiovascular: Negative for chest pain and palpitations.  Gastrointestinal: Negative for abdominal pain, nausea and vomiting.  Neurological: Positive for headaches. Negative for dizziness and weakness.    Social History   Tobacco Use  . Smoking status: Never Smoker  . Smokeless tobacco: Never Used  Substance Use Topics  . Alcohol use: No    Alcohol/week: 0.0 standard drinks      Objective:   Temp (!) 97.3 F (36.3 C) (Oral)  Vitals:   12/21/18 1552  Temp: (!) 97.3 F (36.3 C)  TempSrc: Oral     Physical Exam  Awake, alert, oriented, in no distress  Assessment & Plan    1. Upper respiratory tract infection, unspecified type  - amoxicillin (AMOXIL) 500 MG tablet; Take 1 tablet (500 mg total) by mouth 2 (two) times daily for 10 days.  Dispense: 20 tablet; Refill: 0  Call if symptoms change or if not rapidly improving.    I discussed the assessment and treatment plan with the patient. The patient was provided an opportunity to ask questions and all were answered. The patient agreed with the  plan and demonstrated an understanding of the instructions.   The patient was advised to call back or seek an in-person evaluation if the symptoms worsen or if the condition fails to improve as anticipated.  I provided 8 minutes of non-face-to-face time during this encounter.   The entirety of the information documented in the History of Present Illness, Review of Systems and Physical Exam were personally obtained by me. Portions of this information were initially documented by Meyer Cory, CMA and reviewed by me for thoroughness and accuracy.      Lelon Huh, MD  [facility]

## 2018-12-24 ENCOUNTER — Ambulatory Visit (INDEPENDENT_AMBULATORY_CARE_PROVIDER_SITE_OTHER): Payer: BC Managed Care – PPO | Admitting: Family Medicine

## 2018-12-24 ENCOUNTER — Other Ambulatory Visit: Payer: Self-pay

## 2018-12-24 DIAGNOSIS — J069 Acute upper respiratory infection, unspecified: Secondary | ICD-10-CM | POA: Diagnosis not present

## 2018-12-24 MED ORDER — AMOXICILLIN 500 MG PO TABS: 500.0000 mg | ORAL_TABLET | Freq: Two times a day (BID) | ORAL | 0 refills | Status: AC

## 2018-12-25 ENCOUNTER — Other Ambulatory Visit: Payer: Self-pay | Admitting: Neurosurgery

## 2018-12-31 NOTE — Patient Instructions (Signed)
.   Please review the attached list of medications and notify my office if there are any errors.   . Please bring all of your medications to every appointment so we can make sure that our medication list is the same as yours.   . It is especially important to get the annual flu vaccine this year. If you haven't had it already, please go to your pharmacy or call the office as soon as possible to schedule you flu shot.  

## 2019-01-10 ENCOUNTER — Other Ambulatory Visit: Payer: BC Managed Care – PPO

## 2019-01-10 ENCOUNTER — Other Ambulatory Visit
Admission: RE | Admit: 2019-01-10 | Discharge: 2019-01-10 | Disposition: A | Discharge status: Home / Self Care | Payer: BC Managed Care – PPO | Source: Ambulatory Visit | Attending: Neurosurgery | Admitting: Neurosurgery

## 2019-01-10 ENCOUNTER — Other Ambulatory Visit: Payer: Self-pay

## 2019-01-10 DIAGNOSIS — Z20828 Contact with and (suspected) exposure to other viral communicable diseases: Secondary | ICD-10-CM | POA: Insufficient documentation

## 2019-01-10 DIAGNOSIS — Z01812 Encounter for preprocedural laboratory examination: Secondary | ICD-10-CM | POA: Diagnosis not present

## 2019-01-10 DIAGNOSIS — M5416 Radiculopathy, lumbar region: Secondary | ICD-10-CM | POA: Diagnosis not present

## 2019-01-10 HISTORY — DX: Hypothyroidism, unspecified: E03.9

## 2019-01-10 LAB — URINALYSIS, ROUTINE W REFLEX MICROSCOPIC
Bilirubin Urine: NEGATIVE
Glucose, UA: NEGATIVE mg/dL
Hgb urine dipstick: NEGATIVE
Ketones, ur: NEGATIVE mg/dL
Leukocytes,Ua: NEGATIVE
Nitrite: NEGATIVE
Protein, ur: NEGATIVE mg/dL
Specific Gravity, Urine: 1.005 (ref 1.005–1.030)
pH: 6 (ref 5.0–8.0)

## 2019-01-10 LAB — CBC
HCT: 47 % — ABNORMAL HIGH (ref 36.0–46.0)
Hemoglobin: 15.6 g/dL — ABNORMAL HIGH (ref 12.0–15.0)
MCH: 30.6 pg (ref 26.0–34.0)
MCHC: 33.2 g/dL (ref 30.0–36.0)
MCV: 92.3 fL (ref 80.0–100.0)
Platelets: 239 10*3/uL (ref 150–400)
RBC: 5.09 MIL/uL (ref 3.87–5.11)
RDW: 12.3 % (ref 11.5–15.5)
WBC: 7.6 10*3/uL (ref 4.0–10.5)
nRBC: 0 % (ref 0.0–0.2)

## 2019-01-10 LAB — TYPE AND SCREEN
ABO/RH(D): A POS
Antibody Screen: NEGATIVE

## 2019-01-10 LAB — PROTIME-INR
INR: 1 (ref 0.8–1.2)
Prothrombin Time: 12.9 seconds (ref 11.4–15.2)

## 2019-01-10 LAB — BASIC METABOLIC PANEL
Anion gap: 10 (ref 5–15)
BUN: 18 mg/dL (ref 6–20)
CO2: 27 mmol/L (ref 22–32)
Calcium: 9.4 mg/dL (ref 8.9–10.3)
Chloride: 102 mmol/L (ref 98–111)
Creatinine, Ser: 0.85 mg/dL (ref 0.44–1.00)
GFR calc Af Amer: >60 mL/min (ref >60)
GFR calc non Af Amer: >60 mL/min (ref >60)
Glucose, Bld: 86 mg/dL (ref 70–99)
Potassium: 4.1 mmol/L (ref 3.5–5.1)
Sodium: 139 mmol/L (ref 135–145)

## 2019-01-10 LAB — SARS CORONAVIRUS 2 (TAT 6-24 HRS): SARS Coronavirus 2: NEGATIVE

## 2019-01-10 LAB — APTT: aPTT: 28 seconds (ref 24–36)

## 2019-01-10 LAB — SURGICAL PCR SCREEN
MRSA, PCR: NEGATIVE
Staphylococcus aureus: NEGATIVE

## 2019-01-10 NOTE — Patient Instructions (Signed)
Your procedure is scheduled on: Mon 11/9 Report to Day Surgery. To find out your arrival time please call [phone] between 1PM - 3PM on Friday 11/6.  Remember: Instructions that are not followed completely may result in serious medical risk,  up to and including death, or upon the discretion of your surgeon and anesthesiologist your  surgery may need to be rescheduled.     _X__ 1. Do not eat food after midnight the night before your procedure.                 No gum chewing or hard candies. You may drink clear liquids up to 2 hours                 before you are scheduled to arrive for your surgery- DO not drink clear                 liquids within 2 hours of the start of your surgery.                 Clear Liquids include:  water, apple juice without pulp, clear carbohydrate                 drink such as Clearfast of Gatorade, Black Coffee or Tea (Do not add                 anything to coffee or tea).  __X__2.  On the morning of surgery brush your teeth with toothpaste and water, you                may rinse your mouth with mouthwash if you wish.  Do not swallow any toothpaste of mouthwash.     ___ 3.  No Alcohol for 24 hours before or after surgery.   ___ 4.  Do Not Smoke or use e-cigarettes For 24 Hours Prior to Your Surgery.                 Do not use any chewable tobacco products for at least 6 hours prior to                 surgery.  ____  5.  Bring all medications with you on the day of surgery if instructed.   __x__  6.  Notify your doctor if there is any change in your medical condition      (cold, fever, infections).     Do not wear jewelry, make-up, hairpins, clips or nail polish. Do not wear lotions, powders, or perfumes. You may wear deodorant. Do not shave 48 hours prior to surgery. Men may shave face and neck. Do not bring valuables to the hospital.    [facility] is not responsible for any belongings or valuables.  Contacts, dentures  or bridgework may not be worn into surgery. Leave your suitcase in the car. After surgery it may be brought to your room. For patients admitted to the hospital, discharge time is determined by your treatment team.   Patients discharged the day of surgery will not be allowed to drive home.   Please read over the following fact sheets that you were given:    __x__ Take these medicines the morning of surgery with A SIP OF WATER:    1. acetaminophen (TYLENOL) 650 MG CR tablet if needed  2. fexofenadine (ALLEGRA) 180 MG tablet  3. fluticasone (FLONASE) 50 MCG/ACT nasal spray  4.levothyroxine (SYNTHROID) 88 MCG tablet  5.  6.  ____  Fleet Enema (as directed)   __x__ Use CHG Soap as directed  ____ Use inhalers on the day of surgery  ____ Stop metformin 2 days prior to surgery    ____ Take 1/2 of usual insulin dose the night before surgery. No insulin the morning          of surgery.   ____ Stop Coumadin/Plavix/aspirin on   __x__ Stopped Anti-inflammatories  etodolac (LODINE) 400 MG tablet already   __x__ Stop supplements until after surgery.  Omega-3 Fatty Acids (FISH OIL PO) today  ____ Bring C-Pap to the hospital.

## 2019-01-14 ENCOUNTER — Encounter
Admission: RE | Disposition: A | Discharge status: Home / Self Care | Payer: Self-pay | Source: Ambulatory Visit | Attending: Neurosurgery

## 2019-01-14 ENCOUNTER — Other Ambulatory Visit: Payer: Self-pay

## 2019-01-14 ENCOUNTER — Ambulatory Visit: Payer: BC Managed Care – PPO | Admitting: Certified Registered"

## 2019-01-14 ENCOUNTER — Ambulatory Visit
Admission: RE | Admit: 2019-01-14 | Discharge: 2019-01-14 | Disposition: A | Discharge status: Home / Self Care | Payer: BC Managed Care – PPO | Source: Ambulatory Visit | Attending: Neurosurgery | Admitting: Neurosurgery

## 2019-01-14 ENCOUNTER — Ambulatory Visit: Payer: BC Managed Care – PPO

## 2019-01-14 DIAGNOSIS — Z79899 Other long term (current) drug therapy: Secondary | ICD-10-CM | POA: Diagnosis not present

## 2019-01-14 DIAGNOSIS — J309 Allergic rhinitis, unspecified: Secondary | ICD-10-CM | POA: Insufficient documentation

## 2019-01-14 DIAGNOSIS — E039 Hypothyroidism, unspecified: Secondary | ICD-10-CM | POA: Insufficient documentation

## 2019-01-14 DIAGNOSIS — M48061 Spinal stenosis, lumbar region without neurogenic claudication: Secondary | ICD-10-CM | POA: Insufficient documentation

## 2019-01-14 DIAGNOSIS — M5416 Radiculopathy, lumbar region: Secondary | ICD-10-CM | POA: Diagnosis not present

## 2019-01-14 DIAGNOSIS — Z881 Allergy status to other antibiotic agents status: Secondary | ICD-10-CM | POA: Diagnosis not present

## 2019-01-14 DIAGNOSIS — M79652 Pain in left thigh: Secondary | ICD-10-CM | POA: Insufficient documentation

## 2019-01-14 DIAGNOSIS — Z419 Encounter for procedure for purposes other than remedying health state, unspecified: Secondary | ICD-10-CM

## 2019-01-14 DIAGNOSIS — Z79890 Hormone replacement therapy (postmenopausal): Secondary | ICD-10-CM | POA: Diagnosis not present

## 2019-01-14 DIAGNOSIS — Z885 Allergy status to narcotic agent status: Secondary | ICD-10-CM | POA: Diagnosis not present

## 2019-01-14 DIAGNOSIS — Z880 Allergy status to penicillin: Secondary | ICD-10-CM | POA: Insufficient documentation

## 2019-01-14 HISTORY — PX: HEMI-MICRODISCECTOMY LUMBAR LAMINECTOMY LEVEL 1: SHX5846

## 2019-01-14 LAB — ABO/RH: ABO/RH(D): A POS

## 2019-01-14 SURGERY — HEMI-MICRODISCECTOMY LUMBAR LAMINECTOMY LEVEL 1
Anesthesia: General | Laterality: Bilateral

## 2019-01-14 MED ORDER — PROPOFOL 10 MG/ML IV BOLUS
INTRAVENOUS | Status: AC
  Filled 2019-01-14: qty 20

## 2019-01-14 MED ORDER — DEXAMETHASONE SODIUM PHOSPHATE 10 MG/ML IJ SOLN
INTRAMUSCULAR | Status: AC
  Filled 2019-01-14: qty 1

## 2019-01-14 MED ORDER — ROCURONIUM BROMIDE 50 MG/5ML IV SOLN
INTRAVENOUS | Status: AC
  Filled 2019-01-14: qty 1

## 2019-01-14 MED ORDER — DEXMEDETOMIDINE HCL IN NACL 200 MCG/50ML IV SOLN
INTRAVENOUS | Status: DC | PRN
  Administered 2019-01-14: 4 ug via INTRAVENOUS

## 2019-01-14 MED ORDER — FENTANYL CITRATE (PF) 100 MCG/2ML IJ SOLN
INTRAMUSCULAR | Status: AC
  Administered 2019-01-14: 25 ug via INTRAVENOUS
  Filled 2019-01-14: qty 2

## 2019-01-14 MED ORDER — LACTATED RINGERS IV SOLN
INTRAVENOUS | Status: DC | PRN
  Administered 2019-01-14: 07:00:00 via INTRAVENOUS

## 2019-01-14 MED ORDER — PROMETHAZINE HCL 25 MG/ML IJ SOLN
6.2500 mg | INTRAMUSCULAR | Status: DC | PRN
  Administered 2019-01-14: 6.25 mg via INTRAVENOUS

## 2019-01-14 MED ORDER — ACETAMINOPHEN 10 MG/ML IV SOLN
INTRAVENOUS | Status: DC | PRN
  Administered 2019-01-14: 1000 mg via INTRAVENOUS

## 2019-01-14 MED ORDER — CLINDAMYCIN PHOSPHATE 300 MG/50ML IV SOLN
300.0000 mg | Freq: Once | INTRAVENOUS | Status: AC
  Administered 2019-01-14: 08:00:00 300 mg via INTRAVENOUS

## 2019-01-14 MED ORDER — PROPOFOL 10 MG/ML IV BOLUS
INTRAVENOUS | Status: DC | PRN
  Administered 2019-01-14: 170 mg via INTRAVENOUS

## 2019-01-14 MED ORDER — FENTANYL CITRATE (PF) 100 MCG/2ML IJ SOLN
25.0000 ug | INTRAMUSCULAR | Status: AC | PRN
  Administered 2019-01-14 (×6): 25 ug via INTRAVENOUS

## 2019-01-14 MED ORDER — MIDAZOLAM HCL 2 MG/2ML IJ SOLN
INTRAMUSCULAR | Status: AC
  Filled 2019-01-14: qty 2

## 2019-01-14 MED ORDER — METHOCARBAMOL 500 MG PO TABS: 500.0000 mg | ORAL_TABLET | Freq: Four times a day (QID) | ORAL | 0 refills | Status: DC | PRN

## 2019-01-14 MED ORDER — SUGAMMADEX SODIUM 200 MG/2ML IV SOLN
INTRAVENOUS | Status: DC | PRN
  Administered 2019-01-14: 290 mg via INTRAVENOUS

## 2019-01-14 MED ORDER — FENTANYL CITRATE (PF) 100 MCG/2ML IJ SOLN
INTRAMUSCULAR | Status: DC | PRN
  Administered 2019-01-14 (×2): 50 ug via INTRAVENOUS

## 2019-01-14 MED ORDER — TRAMADOL HCL 50 MG PO TABS
ORAL_TABLET | ORAL | Status: AC
  Administered 2019-01-14: 50 mg via ORAL
  Filled 2019-01-14: qty 1

## 2019-01-14 MED ORDER — ROCURONIUM BROMIDE 100 MG/10ML IV SOLN
INTRAVENOUS | Status: DC | PRN
  Administered 2019-01-14: 20 mg via INTRAVENOUS
  Administered 2019-01-14: 10 mg via INTRAVENOUS

## 2019-01-14 MED ORDER — LIDOCAINE HCL (CARDIAC) PF 100 MG/5ML IV SOSY
PREFILLED_SYRINGE | INTRAVENOUS | Status: DC | PRN
  Administered 2019-01-14: 100 mg via INTRAVENOUS

## 2019-01-14 MED ORDER — EPHEDRINE SULFATE 50 MG/ML IJ SOLN
INTRAMUSCULAR | Status: DC | PRN
  Administered 2019-01-14: 5 mg via INTRAVENOUS

## 2019-01-14 MED ORDER — FAMOTIDINE 20 MG PO TABS
20.0000 mg | ORAL_TABLET | Freq: Once | ORAL | Status: AC
  Administered 2019-01-14: 07:00:00 20 mg via ORAL

## 2019-01-14 MED ORDER — FENTANYL CITRATE (PF) 100 MCG/2ML IJ SOLN
INTRAMUSCULAR | Status: AC
  Filled 2019-01-14: qty 2

## 2019-01-14 MED ORDER — REMIFENTANIL HCL 1 MG IV SOLR
INTRAVENOUS | Status: AC
  Filled 2019-01-14: qty 1000

## 2019-01-14 MED ORDER — EPHEDRINE SULFATE 50 MG/ML IJ SOLN
INTRAMUSCULAR | Status: AC
  Filled 2019-01-14: qty 1

## 2019-01-14 MED ORDER — GLYCOPYRROLATE 0.2 MG/ML IJ SOLN
INTRAMUSCULAR | Status: DC | PRN
  Administered 2019-01-14: 0.2 mg via INTRAVENOUS

## 2019-01-14 MED ORDER — PHENYLEPHRINE HCL (PRESSORS) 10 MG/ML IV SOLN
INTRAVENOUS | Status: DC | PRN
  Administered 2019-01-14 (×4): 100 ug via INTRAVENOUS

## 2019-01-14 MED ORDER — SODIUM CHLORIDE 0.9 % IV SOLN
INTRAVENOUS | Status: DC | PRN
  Administered 2019-01-14: 08:00:00 30 ug/min via INTRAVENOUS

## 2019-01-14 MED ORDER — DEXAMETHASONE SODIUM PHOSPHATE 10 MG/ML IJ SOLN
INTRAMUSCULAR | Status: DC | PRN
  Administered 2019-01-14: 10 mg via INTRAVENOUS

## 2019-01-14 MED ORDER — ACETAMINOPHEN 10 MG/ML IV SOLN
INTRAVENOUS | Status: AC
  Filled 2019-01-14: qty 100

## 2019-01-14 MED ORDER — TRAMADOL HCL 50 MG PO TABS: 50.0000 mg | ORAL_TABLET | Freq: Four times a day (QID) | ORAL | 0 refills | Status: DC | PRN

## 2019-01-14 MED ORDER — ONDANSETRON HCL 4 MG/2ML IJ SOLN
INTRAMUSCULAR | Status: DC | PRN
  Administered 2019-01-14 (×2): 4 mg via INTRAVENOUS

## 2019-01-14 MED ORDER — SODIUM CHLORIDE (PF) 0.9 % IJ SOLN
INTRAMUSCULAR | Status: AC
  Filled 2019-01-14: qty 10

## 2019-01-14 MED ORDER — OXYCODONE HCL 5 MG PO TABS: 5.0000 mg | ORAL_TABLET | Freq: Once | ORAL | Status: DC | PRN

## 2019-01-14 MED ORDER — MIDAZOLAM HCL 2 MG/2ML IJ SOLN
INTRAMUSCULAR | Status: DC | PRN
  Administered 2019-01-14: 2 mg via INTRAVENOUS

## 2019-01-14 MED ORDER — THROMBIN 5000 UNITS EX SOLR
CUTANEOUS | Status: DC | PRN
  Administered 2019-01-14: 5000 [IU] via TOPICAL

## 2019-01-14 MED ORDER — TRAMADOL HCL 50 MG PO TABS
50.0000 mg | ORAL_TABLET | Freq: Once | ORAL | Status: AC
  Administered 2019-01-14: 13:00:00 50 mg via ORAL
  Filled 2019-01-14: qty 1

## 2019-01-14 MED ORDER — CLINDAMYCIN PHOSPHATE 300 MG/50ML IV SOLN
INTRAVENOUS | Status: AC
  Filled 2019-01-14: qty 50

## 2019-01-14 MED ORDER — FAMOTIDINE 20 MG PO TABS
ORAL_TABLET | ORAL | Status: AC
  Administered 2019-01-14: 20 mg via ORAL
  Filled 2019-01-14: qty 1

## 2019-01-14 MED ORDER — SUCCINYLCHOLINE CHLORIDE 20 MG/ML IJ SOLN
INTRAMUSCULAR | Status: DC | PRN
  Administered 2019-01-14: 100 mg via INTRAVENOUS

## 2019-01-14 MED ORDER — PROMETHAZINE HCL 25 MG/ML IJ SOLN
INTRAMUSCULAR | Status: AC
  Filled 2019-01-14: qty 1

## 2019-01-14 MED ORDER — GLYCOPYRROLATE 0.2 MG/ML IJ SOLN
INTRAMUSCULAR | Status: AC
  Filled 2019-01-14: qty 1

## 2019-01-14 MED ORDER — LIDOCAINE-EPINEPHRINE (PF) 1 %-1:200000 IJ SOLN
INTRAMUSCULAR | Status: DC | PRN
  Administered 2019-01-14: 10 mL

## 2019-01-14 MED ORDER — SUCCINYLCHOLINE CHLORIDE 20 MG/ML IJ SOLN
INTRAMUSCULAR | Status: AC
  Filled 2019-01-14: qty 1

## 2019-01-14 MED ORDER — REMIFENTANIL HCL 1 MG IV SOLR
INTRAVENOUS | Status: DC | PRN
  Administered 2019-01-14: .2 ug/kg/min via INTRAVENOUS

## 2019-01-14 MED ORDER — ONDANSETRON HCL 4 MG/2ML IJ SOLN
INTRAMUSCULAR | Status: AC
  Filled 2019-01-14: qty 4

## 2019-01-14 MED ORDER — OXYCODONE HCL 5 MG PO TABS: 5.0000 mg | ORAL_TABLET | ORAL | 0 refills | Status: DC | PRN

## 2019-01-14 MED ORDER — METHYLPREDNISOLONE ACETATE 40 MG/ML IJ SUSP
INTRAMUSCULAR | Status: DC | PRN
  Administered 2019-01-14: 40 mg

## 2019-01-14 MED ORDER — PHENYLEPHRINE HCL (PRESSORS) 10 MG/ML IV SOLN
INTRAVENOUS | Status: AC
  Filled 2019-01-14: qty 3

## 2019-01-14 MED ORDER — OXYCODONE HCL 5 MG/5ML PO SOLN: 5.0000 mg | Freq: Once | ORAL | Status: DC | PRN

## 2019-01-14 MED ORDER — DEXMEDETOMIDINE HCL IN NACL 80 MCG/20ML IV SOLN
INTRAVENOUS | Status: AC
  Filled 2019-01-14: qty 20

## 2019-01-14 MED ORDER — LACTATED RINGERS IV SOLN
INTRAVENOUS | Status: DC
  Administered 2019-01-14: 07:00:00 via INTRAVENOUS

## 2019-01-14 SURGICAL SUPPLY — 59 items
BUR NEURO DRILL SOFT 3.0X3.8M (BURR) ×3 IMPLANT
CANISTER SUCT 1200ML W/VALVE (MISCELLANEOUS) ×6 IMPLANT
CHLORAPREP W/TINT 26 (MISCELLANEOUS) ×6 IMPLANT
COUNTER NEEDLE 20/40 LG (NEEDLE) ×3 IMPLANT
COVER LIGHT HANDLE STERIS (MISCELLANEOUS) ×6 IMPLANT
COVER WAND RF STERILE (DRAPES) ×3 IMPLANT
CUP MEDICINE 2OZ PLAST GRAD ST (MISCELLANEOUS) ×3 IMPLANT
DERMABOND ADVANCED (GAUZE/BANDAGES/DRESSINGS) ×2
DERMABOND ADVANCED .7 DNX12 (GAUZE/BANDAGES/DRESSINGS) ×1 IMPLANT
DRAPE C-ARM 42X72 X-RAY (DRAPES) ×6 IMPLANT
DRAPE LAPAROTOMY 100X77 ABD (DRAPES) ×3 IMPLANT
DRAPE MICROSCOPE SPINE 48X150 (DRAPES) IMPLANT
DRAPE SURG 17X11 SM STRL (DRAPES) ×3 IMPLANT
DRSG TEGADERM 4X4.75 (GAUZE/BANDAGES/DRESSINGS) IMPLANT
DRSG TELFA 4X3 1S NADH ST (GAUZE/BANDAGES/DRESSINGS) IMPLANT
DURASEAL APPLICATOR TIP (TIP) IMPLANT
DURASEAL SPINE SEALANT 3ML (MISCELLANEOUS) IMPLANT
ELECT CAUTERY BLADE TIP 2.5 (TIP) ×3
ELECT EZSTD 165MM 6.5IN (MISCELLANEOUS) ×3
ELECT REM PT RETURN 9FT ADLT (ELECTROSURGICAL) ×3
ELECTRODE CAUTERY BLDE TIP 2.5 (TIP) ×1 IMPLANT
ELECTRODE EZSTD 165MM 6.5IN (MISCELLANEOUS) ×1 IMPLANT
ELECTRODE REM PT RTRN 9FT ADLT (ELECTROSURGICAL) ×1 IMPLANT
GAUZE SPONGE 4X4 12PLY STRL (GAUZE/BANDAGES/DRESSINGS) ×3 IMPLANT
GLOVE BIOGEL PI IND STRL 7.0 (GLOVE) ×1 IMPLANT
GLOVE BIOGEL PI INDICATOR 7.0 (GLOVE) ×2
GLOVE INDICATOR 8.0 STRL GRN (GLOVE) ×9 IMPLANT
GLOVE SURG SYN 7.0 (GLOVE) ×6 IMPLANT
GLOVE SURG SYN 8.0 (GLOVE) ×3 IMPLANT
GOWN STRL REUS W/ TWL XL LVL3 (GOWN DISPOSABLE) ×1 IMPLANT
GOWN STRL REUS W/TWL MED LVL3 (GOWN DISPOSABLE) ×3 IMPLANT
GOWN STRL REUS W/TWL XL LVL3 (GOWN DISPOSABLE) ×2
GRADUATE 1200CC STRL 31836 (MISCELLANEOUS) ×3 IMPLANT
KIT TURNOVER KIT A (KITS) ×3 IMPLANT
KIT WILSON FRAME (KITS) ×3 IMPLANT
KNIFE BAYONET SHORT DISCETOMY (MISCELLANEOUS) ×3 IMPLANT
MARKER SKIN DUAL TIP RULER LAB (MISCELLANEOUS) ×6 IMPLANT
NDL SAFETY ECLIPSE 18X1.5 (NEEDLE) ×1 IMPLANT
NEEDLE HYPO 18GX1.5 SHARP (NEEDLE) ×2
NEEDLE HYPO 22GX1.5 SAFETY (NEEDLE) ×3 IMPLANT
NS IRRIG 1000ML POUR BTL (IV SOLUTION) ×3 IMPLANT
PACK LAMINECTOMY NEURO (CUSTOM PROCEDURE TRAY) ×3 IMPLANT
PAD ARMBOARD 7.5X6 YLW CONV (MISCELLANEOUS) ×3 IMPLANT
SLEEVE PROTECTION STRL DISP (MISCELLANEOUS) ×3 IMPLANT
SPOGE SURGIFLO 8M (HEMOSTASIS) ×2
SPONGE SURGIFLO 8M (HEMOSTASIS) ×1 IMPLANT
STAPLER SKIN PROX 35W (STAPLE) IMPLANT
SUT NURALON 4 0 TR CR/8 (SUTURE) IMPLANT
SUT POLYSORB 2-0 5X18 GS-10 (SUTURE) ×12 IMPLANT
SUT VIC AB 0 CT1 18XCR BRD 8 (SUTURE) ×1 IMPLANT
SUT VIC AB 0 CT1 8-18 (SUTURE) ×2
SYR 10ML LL (SYRINGE) ×6 IMPLANT
SYR 30ML LL (SYRINGE) ×3 IMPLANT
SYR 3ML LL SCALE MARK (SYRINGE) ×3 IMPLANT
TOWEL OR 17X26 4PK STRL BLUE (TOWEL DISPOSABLE) ×12 IMPLANT
TUBE MATRX SPINL 18MM 6CM DISP (INSTRUMENTS) ×2
TUBE METRX SPINAL 18X6 DISP (INSTRUMENTS) ×1 IMPLANT
TUBING CONNECTING 10 (TUBING) ×2 IMPLANT
TUBING CONNECTING 10' (TUBING) ×1

## 2019-01-14 NOTE — Anesthesia Procedure Notes (Signed)
Procedure Name: Intubation Performed by: Fletcher-Harrison, Esteen Delpriore, CRNA Pre-anesthesia Checklist: Patient identified, Emergency Drugs available, Suction available and Patient being monitored Patient Re-evaluated:Patient Re-evaluated prior to induction Oxygen Delivery Method: Circle system utilized Preoxygenation: Pre-oxygenation with 100% oxygen Induction Type: IV induction Ventilation: Mask ventilation without difficulty Laryngoscope Size: McGraph and 3 Grade View: Grade I Tube type: Oral Tube size: 7.0 mm Number of attempts: 1 Airway Equipment and Method: Stylet Placement Confirmation: ETT inserted through vocal cords under direct vision,  positive ETCO2,  CO2 detector and breath sounds checked- equal and bilateral Secured at: 21 cm Tube secured with: Tape Dental Injury: Teeth and Oropharynx as per pre-operative assessment        

## 2019-01-14 NOTE — Anesthesia Postprocedure Evaluation (Signed)
Anesthesia Post Note  Patient: Lisa Skinner  Procedure(s) Performed: L4-5 HEMILAMINECTOMY & DISCECTOMY (Bilateral )  Patient location during evaluation: PACU Anesthesia Type: General Level of consciousness: awake and alert Pain management: pain level controlled Vital Signs Assessment: post-procedure vital signs reviewed and stable Respiratory status: spontaneous breathing, nonlabored ventilation and respiratory function stable Cardiovascular status: blood pressure returned to baseline and stable Postop Assessment: no apparent nausea or vomiting Anesthetic complications: no     Last Vitals:  Vitals:   01/14/19 1127 01/14/19 1157  BP: (!) 125/53 (!) 115/53  Pulse: 92 83  Resp: 16 16  Temp:    SpO2: 100% 100%    Last Pain:  Vitals:   01/14/19 1157  TempSrc:   PainSc: [place]

## 2019-01-14 NOTE — Transfer of Care (Signed)
Immediate Anesthesia Transfer of Care Note  Patient: Lisa Skinner  Procedure(s) Performed: L4-5 HEMILAMINECTOMY & DISCECTOMY (Bilateral )  Patient Location: PACU  Anesthesia Type:General  Level of Consciousness: awake, drowsy, patient cooperative and responds to stimulation  Airway & Oxygen Therapy: Patient Spontanous Breathing and Patient connected to face mask oxygen  Post-op Assessment: Report given to RN and Post -op Vital signs reviewed and stable  Post vital signs: Reviewed and stable  Last Vitals:  Vitals Value Taken Time  BP 156/71 01/14/19 0951  Temp 36.3 C 01/14/19 0951  Pulse 108 01/14/19 1000  Resp 21 01/14/19 1000  SpO2 100 % 01/14/19 1000  Vitals shown include unvalidated device data.  Last Pain:  Vitals:   01/14/19 0617  TempSrc: Temporal  PainSc: 4          Complications: No apparent anesthesia complications

## 2019-01-14 NOTE — H&P (Signed)
Lisa Skinner is an 58 y.o. female.   Chief Complaint: Bilateral leg pain HPI: Lisa Skinner is here for evaluation of ongoing symptoms that started approximately 10 months ago. She states that she is having gnawing severe back pain in the mid to right paramedian region of the lumbar spine. This is associated with some pain and numbness that will go down the right side into the thigh, calf, and foot. She has more recently also had some left leg symptoms. She denies any obvious event preceding the symptom onset. She does report some symptoms similar to this years ago but this improved with conservative management. For the current symptoms, she has undergone epidural steroid injections which saw some benefit with the first and limited with the second. She has been to physical therapy. She is attempted multiple medications including steroids. She states that the pain is still bothersome and the tingling in the leg is limiting what she can do. She used to be an avid walker and performing less activity at this time.   MRI showed stenosis at L4/5 and EMG did find evidence of a radiculopathy  Past Medical History:  Diagnosis Date  . Allergic rhinitis   . Ankle sprain 08/18/2016   right  . History of mammogram 09/15/2015   neg  . History of mumps   . History of shingles   . Hypothyroidism   . Thyroid disease    hypothyroid    Past Surgical History:  Procedure Laterality Date  . ABDOMINAL HYSTERECTOMY  2003   with BSO multiple cyst; BVD  . DILATION AND CURETTAGE OF UTERUS  07/17/1992  . KNEE ARTHROSCOPY  90S   LEFT  . LASIK  05/20/1994    Family History  Problem Relation Age of Onset  . Cancer Mother 35        UTERINE  . Hypertension Mother   . COPD Mother   . Congestive Heart Failure Mother   . Alcohol abuse Father   . Hypertension Father   . Congestive Heart Failure Father   . Pneumonia Father        died from aspiration pneumonia  . Melanoma Sister 50       recurrence 2013    Social History:  reports that she has never smoked. She has never used smokeless tobacco. She reports that she does not drink alcohol or use drugs.  Allergies:  Allergies  Allergen Reactions  . Codeine Other (See Comments)    Abdominal pain; BP spike; dizzy  . Augmentin [Amoxicillin-Pot Clavulanate] Other (See Comments)    (GI upset) / pt can tolerate plain amoxicillin Did it involve swelling of the face/tongue/throat, SOB, or low BP? No Did it involve sudden or severe rash/hives, skin peeling, or any reaction on the inside of your mouth or nose? No Did you need to seek medical attention at a hospital or doctor's office? No When did it last happen? If all above answers are "NO", may proceed with cephalosporin use.   . Cephalexin Other (See Comments)    GI Upset.    Medications Prior to Admission  Medication Sig Dispense Refill  . acetaminophen (TYLENOL) 650 MG CR tablet Take 1,300 mg by mouth every 8 (eight) hours as needed for pain.    . Carboxymethylcell-Hypromellose (GENTEAL OP) Place 2 drops into both eyes 2 (two) times daily.    Marland Kitchen estradiol (VIVELLE-DOT) 0.05 MG/24HR patch APPLY 1 PATCH(0.05 MG) EXTERNALLY TO THE SKIN 2 TIMES A WEEK (Patient taking differently: Place 1 patch onto  the skin 2 (two) times a week. ) 8 patch 12  . etodolac (LODINE) 400 MG tablet TAKE 1 TABLET BY MOUTH TWICE DAILY AS NEEDED (Patient taking differently: Take 400 mg by mouth 2 (two) times daily. ) 30 tablet 4  . fexofenadine (ALLEGRA) 180 MG tablet Take 180 mg by mouth daily.     . fluticasone (FLONASE) 50 MCG/ACT nasal spray instill 2 sprays into each nostril once daily (Patient taking differently: Place 2 sprays into both nostrils daily. ) 16 g 6  . levothyroxine (SYNTHROID) 88 MCG tablet TAKE 1 TABLET(88 MCG) BY MOUTH DAILY (Patient taking differently: Take 88 mcg by mouth daily before breakfast. ) 90 tablet 3  . Multiple Vitamins-Minerals (AIRBORNE GUMMIES PO) Take 3 tablets by mouth daily.     . Multiple Vitamins-Minerals (MULTIVITAMIN ADULTS 50+) TABS Take 1 tablet by mouth daily.    . Omega-3 Fatty Acids (FISH OIL PO) Take 1 capsule by mouth daily.     . Probiotic CAPS Take 1 capsule by mouth daily.      No results found for this or any previous visit (from the past 48 hour(s)). No results found.  ROS General ROS: Negative Psychological ROS: Negative Ophthalmic ROS: Negative ENT ROS: Negative Hematological and Lymphatic ROS: Negative  Endocrine ROS: Negative Respiratory ROS: Negative Cardiovascular ROS: Negative Gastrointestinal ROS: Negative Genito-Urinary ROS: Negative Musculoskeletal ROS: Positive for back pain Neurological ROS: Positive for right leg numbness Dermatological ROS: Negative  Blood pressure (!) 157/89, pulse 86, temperature 97.8 F (36.6 C), temperature source Temporal, resp. rate 12, SpO2 100 %. Physical Exam  General appearance: Alert, cooperative, in no acute distress Head: Normocephalic, atraumatic Eyes: Normal, EOM intact Oropharynx: Wearing facemask Pulm: Clear to auscultation CV: Regular rate and rhythm Back: Tenderness to palpation over the right paramedian region in the lower lumbar spine Ext: No edema in LE bilaterally  Neurologic exam:  Mental status: alertness: alert, affect: normal Speech: fluent and clear Motor:strength symmetric 5/5 in bilateral lower extremities in hip flexion, knee flexion, knee extension, dorsiflexion, plantarflexion Sensory: intact to light touch in bilateral lower extremities with exception of lateral right calf and foot which is decreased Reflexes: 2+ and symmetric bilaterally for patella Gait: normal    MRI lumbar spine: There is a normal lordotic curvature. Alignment appears well-maintained. Disc space heights are well-preserved with exception of mild degeneration at L4-5 and more moderate at L5-S1. There is also some osteophyte complex at L5-S1. There is a broad disc bulge eccentric to the left at  L4-5 resulting in lateral recess stenosis, there is more mild lateral recess stenosis at L5-S1.  Assessment/Plan Plan is for bilateral decompression at L4/5  Deetta Perla, MD 01/14/2019, 6:39 AM

## 2019-01-14 NOTE — Discharge Instructions (Addendum)
Your surgeon has performed an operation on your lumbar spine (low back) to relieve pressure on one or more nerves. Many times, patients feel better immediately after surgery and can overdo it. Even if you feel well, it is important that you follow these activity guidelines. If you do not let your back heal properly from the surgery, you can increase the chance of a disc herniation and/or return of your symptoms. The following are instructions to help in your recovery once you have been discharged from the hospital.  * Do not take anti-inflammatory medications for 3 days after surgery (naproxen [Aleve], ibuprofen [Advil, Motrin], celecoxib [Celebrex], etc.) * You may restart omega-3 supplement after 7 days. Activity    No bending, lifting, or twisting (BLT). Avoid lifting objects heavier than 10 pounds (gallon milk jug).  Where possible, avoid household activities that involve lifting, bending, pushing, or pulling such as laundry, vacuuming, grocery shopping, and childcare. Try to arrange for help from friends and family for these activities while your back heals.  Increase physical activity slowly as tolerated.  Taking short walks is encouraged, but avoid strenuous exercise. Do not jog, run, bicycle, lift weights, or participate in any other exercises unless specifically allowed by your doctor. Avoid prolonged sitting, including car rides.  Talk to your doctor before resuming sexual activity.  You should not drive until cleared by your doctor.  Until released by your doctor, you should not return to work or school.  You should rest at home and let your body heal.   You may shower two days after your surgery.  After showering, lightly dab your incision dry. Do not take a tub bath or go swimming for 3 weeks, or until approved by your doctor at your follow-up appointment.  If you smoke, we strongly recommend that you quit.  Smoking has been proven to interfere with normal healing in your back  and will dramatically reduce the success rate of your surgery. Please contact [facility] (800-QUIT-NOW) and use the resources at www.[facility].com for assistance in stopping smoking.  Surgical Incision   If you have a dressing on your incision, you may remove it three days after your surgery. Keep your incision area clean and dry.  If you have staples or stitches on your incision, you should have a follow up scheduled for removal. If you do not have staples or stitches, you will have steri-strips (small pieces of surgical tape) or Dermabond glue. The steri-strips/glue should begin to peel away within about a week (it is fine if the steri-strips fall off before then). If the strips are still in place one week after your surgery, you may gently remove them.  Diet            You may return to your usual diet. Be sure to stay hydrated.  When to Contact us  Although your surgery and recovery will likely be uneventful, you may have some residual numbness, aches, and pains in your back and/or legs. This is normal and should improve in the next few weeks.  However, should you experience any of the following, contact us immediately:  New numbness or weakness  Pain that is progressively getting worse, and is not relieved by your pain medications or rest  Bleeding, redness, swelling, pain, or drainage from surgical incision  Chills or flu-like symptoms  Fever greater than 101.0 F (38.3 C)  Problems with bowel or bladder functions  Difficulty breathing or shortness of breath  Warmth, tenderness, or swelling in your calf  Contact Information  During office hours (Monday-Friday 9 am to 5 pm), please call your physician at [phone]  After hours and weekends, please call the [place] Operator at [phone] and ask for the Neurosurgery Resident On Call   For a life-threatening emergency, call [place]   1) The drugs that you were given will stay  in your system until tomorrow so for the next 24 hours you should not:  A) Drive an automobile B) Make any legal decisions C) Drink any alcoholic beverage   2) You may resume regular meals tomorrow.  Today it is better to start with liquids and gradually work up to solid foods.  You may eat anything you prefer, but it is better to start with liquids, then soup and crackers, and gradually work up to solid foods.   3) Please notify your doctor immediately if you have any unusual bleeding, trouble breathing, redness and pain at the surgery site, drainage, fever, or pain not relieved by medication.    4) Additional Instructions:        Please contact your physician with any problems or Same Day Surgery at [phone], Monday through Friday 6 am to 4 pm, or [place] at [facility] number at [phone].

## 2019-01-14 NOTE — Anesthesia Post-op Follow-up Note (Signed)
Anesthesia QCDR form completed.        

## 2019-01-14 NOTE — Op Note (Signed)
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 MRI revealedleft greater than rightL4/5stenosis compressingthetraversingnerve roots.She had a EMG/NCS that showed a chronic L5 radiculopathy on the right. We discussed bilateral decompression at that level.Therisks of surgery were explained to include hematoma, infection, damage to nerve roots, CSF leak, weakness, numbness, pain, need for future surgery including fusion, heart attack, and stroke.Sheelected to proceed with surgery for symptom relief.  Procedure The patient was brought to the OR after informed consent was obtained.She was given general anesthesia and intubated by the anesthesia service. Vascular access lines were placed.The patient was then placed prone on a Wilson frameensuring all pressure points were padded.Antibiotics were administered.A time-out was performed per protocol.   The patient was sterilely prepped and draped. Fluoroscopy confirmedL4/5interspaceandanincision was planned1.5cm off midlineon theright.The incision was instilled withlocal anesthetic with epinephrine. The skin was opened sharply and the dissection taken to the fascia. This was incised and initial dilator  placedthe spinous processes and lamina of L4on the rightout to the medial edge of the facet. Serial dilatorswere inserted via fluoroscopy and the final73mm tube was placed at depth of6cm.  The microscope was brought into the field. The overlying muscle was removed from lamina and medial facet.Next, a matchstickdrill bit was used to remove theL4lamina centrallyand going laterally.The underlying ligament was freed and removed with combination of rongeurs. The dura was seen to be full and intact.Once all ligament and soft tissue was removed, attention was turned to inspection of thelateral recess.The ligament was removed here with curettes and rongeurs until a ball tip probe passed freely.Working under the dura, no disc protrusion was seen. The epidural space was seen to be flat after this and a blunt instrument passed out laterally easily. Hemostasis was obtained and wound irrigated. Depomedrol was placed along thecal sac and nerve root.The working channel was removed.  Next, a similar incision was marked on the left and instilled withlocal anesthetic with epinephrine. The skin was opened sharply and the dissection taken to the fascia. This was incised and initial dilator placedthe spinous processes and lamina of L4on the leftout to the medial edge of the facet. Serial dilatorswere inserted via fluoroscopy and the final29mm tube was placed at depth of6cm.  The overlying muscle was removed from lamina and medial facet.Next, a matchstickdrill bit was used to remove theL4lamina centrallyand going laterally.The underlying ligament was freed and removed with combination of rongeurs. The dura was seen to be full and intact.Once all ligament and soft tissue was removed, attention was turned to inspection of thelateral recess.The ligament was removed here with curettes and rongeurs until a ball tip probe passed freely.We retracted the dura medially and a small disc protrusion was  seen. The disc space was cauterized and then entered sharply through the PLL. The space was probed with curettes and a small amount of disc material was removed more laterally. Medially, a soft probe found the disc space to be flat and without compression.  The area was irrigated profusely to remove other small fragments. Hemostasis was obtained and wound irrigated. Depomedrol was  placed along thecal sac and nerve root.The working channel was removed.  The microscope was removed.On both sides, themuscle andfasciawas then closed using 0and 2-0vicryl followed by thesubcutaneous and dermal layers with 2-0 vicryluntil the epidermis was well approximated. The skin was closed withDermabond..  The patient was returned to supine position and extubated by the anesthesia service. The patient was then taken to the PACU for post-operative care whereshe was moving extremities symmetrically.   ESTIMATED BLOOD LOSS: 20cc  SPECIMENS None  IMPLANT None   I performed the case in its entiretywith assistance of PA, Lisa Skinner, Lisa Skinner

## 2019-01-14 NOTE — Interval H&P Note (Signed)
History and Physical Interval Note:  01/14/2019 6:44 AM  Lisa Skinner  has presented today for surgery, with the diagnosis of lumbar radiculopathy m54.16.  The various methods of treatment have been discussed with the patient and family. After consideration of risks, benefits and other options for treatment, the patient has consented to  Procedure(s): L4-5 HEMILAMINECTOMY & DISCECTOMY (Bilateral) as a surgical intervention.  The patient's history has been reviewed, patient examined, no change in status, stable for surgery.  I have reviewed the patient's chart and labs.  Questions were answered to the patient's satisfaction.     Deetta Perla

## 2019-01-14 NOTE — Discharge Summary (Signed)
Procedure: L4-5 hemilaminectomy and discectomy bilateral Procedure date: 01/14/2019 Diagnosis: Lumbar radiculopathy   History: Lisa Skinner is s/p L4-5 hemilaminectomy and discectomy for lumbar radiculopathy  POD0: Doing well postoperatively.  Evaluated in postop recovery.  Complains of new left thigh pain and back pain.  Right lower extremity symptoms that were present prior to surgery have resolved.  Feels that walking improves pain.  Denies any new lower extremity numbness/tingling  Physical Exam: Vitals:   01/14/19 0617  BP: (!) 157/89  Pulse: 86  Resp: 12  Temp: 97.8 F (36.6 C)  SpO2: 100%   Strength:5/5 throughout lower extremities Sensation: intact and symmetric throughout lower extremities Skin: Glue intact at incision sites  Data:  Recent Labs  Lab 01/10/19 1103  NA 139  K 4.1  CL 102  CO2 27  BUN 18  CREATININE 0.85  GLUCOSE 86  CALCIUM 9.4   No results for input(s): AST, ALT, ALKPHOS in the last 168 hours.  Invalid input(s): TBILI   Recent Labs  Lab 01/10/19 1103  WBC 7.6  HGB 15.6*  HCT 47.0*  PLT 239   Recent Labs  Lab 01/10/19 1103  APTT 28  INR 1.0         Other tests/results: No imaging reviewed  Assessment/Plan:  Lisa Skinner is POD0 s/p L4-5 hemilaminectomy and discectomy.  Patient is recovering well. Will continue post op pain management with tylenol, muscle relaxer, and pain medication as needed.  Symptoms that were present prior to surgery have resolved.  Reviewed discharge instructions including wound care and activity restrictions.  She is scheduled to follow up in clinic in approximately 2 weeks.  Advised to contact office if any questions or concerns arise before then.  Marin Olp PA-C Department of Neurosurgery

## 2019-01-14 NOTE — Anesthesia Preprocedure Evaluation (Addendum)
Anesthesia Evaluation  Patient identified by MRN, date of birth, ID band Patient awake    Reviewed: Allergy & Precautions, H&P , NPO status , Patient's Chart, lab work & pertinent test results  Airway Mallampati: II  TM Distance: >3 FB     Dental  (+) Teeth Intact   Pulmonary neg pulmonary ROS,           Cardiovascular negative cardio ROS       Neuro/Psych negative neurological ROS  negative psych ROS   GI/Hepatic negative GI ROS, Neg liver ROS,   Endo/Other  Hypothyroidism   Renal/GU      Musculoskeletal   Abdominal   Peds  Hematology negative hematology ROS (+)   Anesthesia Other Findings Past Medical History: No date: Allergic rhinitis 08/18/2016: Ankle sprain     Comment:  right 09/15/2015: History of mammogram     Comment:  neg No date: History of mumps No date: History of shingles No date: Hypothyroidism No date: Thyroid disease     Comment:  hypothyroid  Past Surgical History: 2003: ABDOMINAL HYSTERECTOMY     Comment:  with BSO multiple cyst; BVD 07/17/1992: DILATION AND CURETTAGE OF UTERUS 90S: KNEE ARTHROSCOPY     Comment:  LEFT 05/20/1994: LASIK     Reproductive/Obstetrics negative OB ROS                            Anesthesia Physical Anesthesia Plan  ASA: II  Anesthesia Plan: General ETT   Post-op Pain Management:    Induction:   PONV Risk Score and Plan: Ondansetron, Dexamethasone, Midazolam and Treatment may vary due to age or medical condition  Airway Management Planned:   Additional Equipment:   Intra-op Plan:   Post-operative Plan:   Informed Consent: I have reviewed the patients History and Physical, chart, labs and discussed the procedure including the risks, benefits and alternatives for the proposed anesthesia with the patient or authorized representative who has indicated his/her understanding and acceptance.     Dental Advisory  Given  Plan Discussed with: Anesthesiologist, CRNA and Surgeon  Anesthesia Plan Comments:         Anesthesia Quick Evaluation

## 2019-01-17 ENCOUNTER — Ambulatory Visit (INDEPENDENT_AMBULATORY_CARE_PROVIDER_SITE_OTHER): Payer: BC Managed Care – PPO | Admitting: Physician Assistant

## 2019-01-17 DIAGNOSIS — R0981 Nasal congestion: Secondary | ICD-10-CM | POA: Diagnosis not present

## 2019-01-17 DIAGNOSIS — H9203 Otalgia, bilateral: Secondary | ICD-10-CM

## 2019-01-17 MED ORDER — AMOXICILLIN 875 MG PO TABS: 875.0000 mg | ORAL_TABLET | Freq: Two times a day (BID) | ORAL | 0 refills | Status: AC

## 2019-01-17 NOTE — Progress Notes (Signed)
Patient: Lisa Skinner Female    DOB: Jul 05, 1960   58 y.o.   MRN: [id number] Visit Date: 01/17/2019  Today's Provider: Trinna Post, PA-C   Chief Complaint  Patient presents with  . Otalgia   Subjective:    Virtual Visit via Video Note  I connected with Lisa Skinner on 01/17/19 at 10:40 AM EST by a video enabled telemedicine application and verified that I am speaking with the correct person using two identifiers.  Location: Patient: Home Provider: Office   I discussed the limitations of evaluation and management by telemedicine and the availability of in person appointments. The patient expressed understanding and agreed to proceed.  Otalgia  There is pain in both ears. The current episode started in the past 7 days. The problem has been unchanged. There has been no fever. The pain is at a severity of 4/10. The pain is moderate. Associated symptoms include coughing. Pertinent negatives include no headaches or sore throat. She has tried nothing for the symptoms.   Denies fever, SOB. COVID test on 01/10/2019 was negative and 01/14/2019 was surgery for laminectomy. She did not stay overnight at the hospital. Reports sore scratchy throat that transitioned into ear pain. She was intubated for the surgery. Still taking allegra and flonase consistently and has not stopped prior to surgery. Reports cervical lymph nodes are tender. Husband without symptoms.  Allergies  Allergen Reactions  . Codeine Other (See Comments)    Abdominal pain; BP spike; dizzy  . Augmentin [Amoxicillin-Pot Clavulanate] Other (See Comments)    (GI upset) / pt can tolerate plain amoxicillin Did it involve swelling of the face/tongue/throat, SOB, or low BP? No Did it involve sudden or severe rash/hives, skin peeling, or any reaction on the inside of your mouth or nose? No Did you need to seek medical attention at a hospital or doctor's office? No When did it last happen? If all above answers are  "NO", may proceed with cephalosporin use.   . Cephalexin Other (See Comments)    GI Upset.     Current Outpatient Medications:  .  acetaminophen (TYLENOL) 650 MG CR tablet, Take 1,300 mg by mouth every 8 (eight) hours as needed for pain., Disp: , Rfl:  .  Carboxymethylcell-Hypromellose (GENTEAL OP), Place 2 drops into both eyes 2 (two) times daily., Disp: , Rfl:  .  estradiol (VIVELLE-DOT) 0.05 MG/24HR patch, APPLY 1 PATCH(0.05 MG) EXTERNALLY TO THE SKIN 2 TIMES A WEEK (Patient taking differently: Place 1 patch onto the skin 2 (two) times a week. ), Disp: 8 patch, Rfl: 12 .  fexofenadine (ALLEGRA) 180 MG tablet, Take 180 mg by mouth daily. , Disp: , Rfl:  .  fluticasone (FLONASE) 50 MCG/ACT nasal spray, instill 2 sprays into each nostril once daily (Patient taking differently: Place 2 sprays into both nostrils daily. ), Disp: 16 g, Rfl: 6 .  levothyroxine (SYNTHROID) 88 MCG tablet, TAKE 1 TABLET(88 MCG) BY MOUTH DAILY (Patient taking differently: Take 88 mcg by mouth daily before breakfast. ), Disp: 90 tablet, Rfl: 3 .  methocarbamol (ROBAXIN) 500 MG tablet, Take 1 tablet (500 mg total) by mouth every 6 (six) hours as needed for muscle spasms., Disp: 60 tablet, Rfl: 0 .  Multiple Vitamins-Minerals (AIRBORNE GUMMIES PO), Take 3 tablets by mouth daily., Disp: , Rfl:  .  Multiple Vitamins-Minerals (MULTIVITAMIN ADULTS 50+) TABS, Take 1 tablet by mouth daily., Disp: , Rfl:  .  Probiotic CAPS, Take 1 capsule by mouth  daily., Disp: , Rfl:  .  traMADol (ULTRAM) 50 MG tablet, Take 1 tablet (50 mg total) by mouth every 6 (six) hours as needed for severe pain., Disp: 30 tablet, Rfl: 0  Review of Systems  Constitutional: Negative.   HENT: Positive for congestion, ear pain and voice change. Negative for postnasal drip, sinus pressure, sinus pain, sore throat and trouble swallowing.   Respiratory: Positive for cough.   Neurological: Negative for headaches.    Social History   Tobacco Use  . Smoking  status: Never Smoker  . Smokeless tobacco: Never Used  Substance Use Topics  . Alcohol use: No    Alcohol/week: 0.0 standard drinks      Objective:   There were no vitals taken for this visit. There were no vitals filed for this visit.There is no height or weight on file to calculate BMI.   Physical Exam Constitutional:      Appearance: Normal appearance.  Pulmonary:     Effort: Pulmonary effort is normal. No respiratory distress.  Neurological:     Mental Status: She is alert and oriented to person, place, and time. Mental status is at baseline.  Psychiatric:        Mood and Affect: Mood normal.        Behavior: Behavior normal.      No results found for any visits on 01/17/19.     Assessment & Plan    1. Acute ear pain, bilateral  Discussed causes of bilateral ear pain including viruses and allergies which I think are the most likely causes. She was tested for COVID on 01/10/2019 and was negative and has isolated since. Counseled this may take ten days to get over after which it's appropriate to take antibiotic. Counseled I will send in abx but I urge her not to take it until 10 days have passed. May consider repeat COVID testing.  - amoxicillin (AMOXIL) 875 MG tablet; Take 1 tablet (875 mg total) by mouth 2 (two) times daily for 7 days.  Dispense: 14 tablet; Refill: 0  2. Sinus congestion  - amoxicillin (AMOXIL) 875 MG tablet; Take 1 tablet (875 mg total) by mouth 2 (two) times daily for 7 days.  Dispense: 14 tablet; Refill: 0 I discussed the assessment and treatment plan with the patient. The patient was provided an opportunity to ask questions and all were answered. The patient agreed with the plan and demonstrated an understanding of the instructions.   The patient was advised to call back or seek an in-person evaluation if the symptoms worsen or if the condition fails to improve as anticipated.  I provided 15 minutes of non-face-to-face time during this encounter.   The entirety of the information documented in the History of Present Illness, Review of Systems and Physical Exam were personally obtained by me. Portions of this information were initially documented by [facility], CMA and reviewed by me for thoroughness and accuracy.         Trinna Post, PA-C  [facility]

## 2019-02-03 ENCOUNTER — Other Ambulatory Visit: Payer: Self-pay | Admitting: Family Medicine

## 2019-02-03 DIAGNOSIS — M5126 Other intervertebral disc displacement, lumbar region: Secondary | ICD-10-CM

## 2019-02-04 ENCOUNTER — Telehealth: Payer: Self-pay

## 2019-02-04 NOTE — Telephone Encounter (Signed)
Requested medication (s) are due for refill today: yes  Requested medication (s) are on the active medication list: yes  Last refill: 01/22/2019  Future visit scheduled: no  Notes to clinic:  Medication was stopped at discharge  Review for refill   Requested Prescriptions  Pending Prescriptions Disp Refills   etodolac (LODINE) 400 MG tablet [Pharmacy Med Name: ETODOLAC 400MG  TABLETS] 30 tablet 4    Sig: TAKE 1 TABLET BY MOUTH TWICE DAILY AS NEEDED     Analgesics:  NSAIDS Failed - 02/03/2019  1:58 PM      Failed - HGB in normal range and within 360 days    Hemoglobin  Date Value Ref Range Status  01/10/2019 15.6 (H) 12.0 - 15.0 g/dL Final         Passed - Cr in normal range and within 360 days    Creatinine, Ser  Date Value Ref Range Status  01/10/2019 0.85 0.44 - 1.00 mg/dL Final         Passed - Patient is not pregnant      Passed - Valid encounter within last 12 months    Recent Outpatient Visits          2 weeks ago Acute ear pain, bilateral   [place], Adriana M, PA-C   1 month ago Upper respiratory tract infection, unspecified type   [facility] Birdie Sons, MD   2 months ago Spinal stenosis of lumbar region without neurogenic claudication   [facility] Birdie Sons, MD   6 months ago [place], [place], [place]   9 months ago Acute non-recurrent frontal sinusitis   [facility] Birdie Sons, MD

## 2019-02-04 NOTE — Telephone Encounter (Signed)
Copied from [place] [id number]. Topic: General - Other >> Jan 21, 2019  4:08 PM [name] wrote: 12/24/2018 date of service [phone] code was used and insurance did not cover claim. patient requesting claim be re submitted. Patient followed up with insurance company and was advised virtual visits were covered. Informed patient please allow 3 to 7 business day turn around time regarding a follow up call >> Feb 04, 2019  4:01 PM [name] wrote: Pt called back asking if billing had gotten in touch with [facility] about her virtual OV on Oct 19th.  She has not heard anything back form anyone.  She was told it was a coding issue by the provider.  CB#  [id number]   >> Jan 24, 2019  9:26 AM [name] wrote: [name] out to patient today to inform as per  [9:18 AM] [name] -    I wanted to let her know that i sent the claim back for reprocessing.  the coding was incorrect for her service.    *patient voice understanding  >> Jan 24, 2019  9:21 AM [name] wrote: Coding Query complete.  Sent claim to be reprocessed.  >> Jan 21, 2019  5:02 PM [name], Jacinto Reap wrote: Pt called back and stated she contacted her insurance and was advised that tele health visits are covered. Pt requests that the billing be corrected and resubmitted.

## 2019-02-05 NOTE — Telephone Encounter (Signed)
I do not know how else to bill it. I've billed it as I have billed every other virtual visit.

## 2019-02-08 NOTE — Telephone Encounter (Signed)
Patient was advised and states that her insurance company advised her that the issue was corrected.FYI

## 2019-03-05 ENCOUNTER — Ambulatory Visit (INDEPENDENT_AMBULATORY_CARE_PROVIDER_SITE_OTHER): Payer: BC Managed Care – PPO | Admitting: Physician Assistant

## 2019-03-05 DIAGNOSIS — J0110 Acute frontal sinusitis, unspecified: Secondary | ICD-10-CM

## 2019-03-05 MED ORDER — AMOXICILLIN 875 MG PO TABS: 875.0000 mg | ORAL_TABLET | Freq: Two times a day (BID) | ORAL | 0 refills | Status: AC

## 2019-03-05 NOTE — Progress Notes (Signed)
Patient: Lisa Skinner Female    DOB: [birth year]   58 y.o.   MRN: [id number] Visit Date: 03/06/2019  Today's Provider: Trinna Post, PA-C   Chief Complaint  Patient presents with  . Sinusitis   Subjective:     Sinusitis This is a recurrent problem. The problem has been gradually worsening since onset. Associated symptoms include congestion, coughing, ear pain and sinus pressure. Pertinent negatives include no chills, diaphoresis, shortness of breath, sneezing or sore throat.     Virtual Visit via Video Note  I connected with MCKAELA PHILIPP on 03/06/19 at  3:00 PM EST by a video enabled telemedicine application and verified that I am speaking with the correct person using two identifiers.  Location: Patient: Home Provider: Office    I discussed the limitations of evaluation and management by telemedicine and the availability of in person appointments. The patient expressed understanding and agreed to proceed.  HPI  Patient reports 4-5 days of sinus congestion on the left side that is worsening. She is taking Allegra, flonase, and sudafed. She was seen for the same a month ago and completed course of amoxicillin.   Allergies  Allergen Reactions  . Codeine Other (See Comments)    Abdominal pain; BP spike; dizzy  . Augmentin [Amoxicillin-Pot Clavulanate] Other (See Comments)    (GI upset) / pt can tolerate plain amoxicillin Did it involve swelling of the face/tongue/throat, SOB, or low BP? No Did it involve sudden or severe rash/hives, skin peeling, or any reaction on the inside of your mouth or nose? No Did you need to seek medical attention at a hospital or doctor's office? No When did it last happen? If all above answers are "NO", may proceed with cephalosporin use.   . Cephalexin Other (See Comments)    GI Upset.     Current Outpatient Medications:  .  acetaminophen (TYLENOL) 650 MG CR tablet, Take 1,300 mg by mouth every 8 (eight) hours as needed  for pain., Disp: , Rfl:  .  Carboxymethylcell-Hypromellose (GENTEAL OP), Place 2 drops into both eyes 2 (two) times daily., Disp: , Rfl:  .  estradiol (VIVELLE-DOT) 0.05 MG/24HR patch, APPLY 1 PATCH(0.05 MG) EXTERNALLY TO THE SKIN 2 TIMES A WEEK (Patient taking differently: Place 1 patch onto the skin 2 (two) times a week. ), Disp: 8 patch, Rfl: 12 .  etodolac (LODINE) 400 MG tablet, Take 1 tablet (400 mg total) by mouth 2 (two) times daily., Disp: 30 tablet, Rfl: 1 .  fexofenadine (ALLEGRA) 180 MG tablet, Take 180 mg by mouth daily. , Disp: , Rfl:  .  fluticasone (FLONASE) 50 MCG/ACT nasal spray, instill 2 sprays into each nostril once daily (Patient taking differently: Place 2 sprays into both nostrils daily. ), Disp: 16 g, Rfl: 6 .  levothyroxine (SYNTHROID) 88 MCG tablet, TAKE 1 TABLET(88 MCG) BY MOUTH DAILY (Patient taking differently: Take 88 mcg by mouth daily before breakfast. ), Disp: 90 tablet, Rfl: 3 .  methocarbamol (ROBAXIN) 500 MG tablet, Take 1 tablet (500 mg total) by mouth every 6 (six) hours as needed for muscle spasms., Disp: 60 tablet, Rfl: 0 .  Multiple Vitamins-Minerals (AIRBORNE GUMMIES PO), Take 3 tablets by mouth daily., Disp: , Rfl:  .  Multiple Vitamins-Minerals (MULTIVITAMIN ADULTS 50+) TABS, Take 1 tablet by mouth daily., Disp: , Rfl:  .  Probiotic CAPS, Take 1 capsule by mouth daily., Disp: , Rfl:  .  traMADol (ULTRAM) 50 MG tablet, Take 1  tablet (50 mg total) by mouth every 6 (six) hours as needed for severe pain., Disp: 30 tablet, Rfl: 0 .  amoxicillin (AMOXIL) 875 MG tablet, Take 1 tablet (875 mg total) by mouth 2 (two) times daily for 7 days., Disp: 14 tablet, Rfl: 0  Review of Systems  Constitutional: Positive for fatigue. Negative for activity change, appetite change, chills, diaphoresis, fever and unexpected weight change.  HENT: Positive for congestion, ear pain, postnasal drip, rhinorrhea, sinus pressure and sinus pain. Negative for ear discharge, sneezing and  sore throat.   Eyes: Positive for discharge, redness and itching.  Respiratory: Positive for cough. Negative for apnea, choking, chest tightness, shortness of breath, wheezing and stridor.   Gastrointestinal: Negative.     Social History   Tobacco Use  . Smoking status: Never Smoker  . Smokeless tobacco: Never Used  Substance Use Topics  . Alcohol use: No    Alcohol/week: 0.0 standard drinks      Objective:   There were no vitals taken for this visit. There were no vitals filed for this visit.There is no height or weight on file to calculate BMI.   Physical Exam Constitutional:      Appearance: Normal appearance.  Pulmonary:     Effort: Pulmonary effort is normal. No respiratory distress.  Skin:    General: Skin is warm and dry.  Neurological:     Mental Status: She is alert.  Psychiatric:        Mood and Affect: Mood normal.        Behavior: Behavior normal.      No results found for any visits on 03/05/19.     Assessment & Plan    Acute non-recurrent frontal sinusitis - Plan: amoxicillin (AMOXIL) 875 MG tablet   Follow Up Instructions:    I discussed the assessment and treatment plan with the patient. The patient was provided an opportunity to ask questions and all were answered. The patient agreed with the plan and demonstrated an understanding of the instructions.   The patient was advised to call back or seek an in-person evaluation if the symptoms worsen or if the condition fails to improve as anticipated.  I provided 15 minutes of non-face-to-face time during this encounter.  The entirety of the information documented in the History of Present Illness, Review of Systems and Physical Exam were personally obtained by me. Portions of this information were initially documented by [facility] and reviewed by me for thoroughness and accuracy.      Trinna Post, PA-C  [facility]

## 2019-05-19 DIAGNOSIS — N63 Unspecified lump in unspecified breast: Secondary | ICD-10-CM | POA: Insufficient documentation

## 2019-05-29 ENCOUNTER — Other Ambulatory Visit: Payer: Self-pay

## 2019-05-29 DIAGNOSIS — N6310 Unspecified lump in the right breast, unspecified quadrant: Secondary | ICD-10-CM

## 2019-05-30 ENCOUNTER — Inpatient Hospital Stay
Admission: RE | Admit: 2019-05-30 | Discharge: 2019-05-30 | Disposition: A | Discharge status: Home / Self Care | Payer: Self-pay | Source: Ambulatory Visit | Attending: *Deleted | Admitting: *Deleted

## 2019-05-30 ENCOUNTER — Other Ambulatory Visit: Payer: Self-pay | Admitting: *Deleted

## 2019-05-30 DIAGNOSIS — Z1231 Encounter for screening mammogram for malignant neoplasm of breast: Secondary | ICD-10-CM

## 2019-06-05 ENCOUNTER — Ambulatory Visit
Admission: RE | Admit: 2019-06-05 | Discharge: 2019-06-05 | Disposition: A | Discharge status: Home / Self Care | Payer: BC Managed Care – PPO | Source: Ambulatory Visit

## 2019-06-05 DIAGNOSIS — N6320 Unspecified lump in the left breast, unspecified quadrant: Secondary | ICD-10-CM | POA: Insufficient documentation

## 2019-06-05 DIAGNOSIS — N6310 Unspecified lump in the right breast, unspecified quadrant: Secondary | ICD-10-CM | POA: Insufficient documentation

## 2019-08-07 ENCOUNTER — Other Ambulatory Visit: Payer: Self-pay | Admitting: Family Medicine

## 2019-09-12 DIAGNOSIS — J329 Chronic sinusitis, unspecified: Secondary | ICD-10-CM

## 2019-09-12 HISTORY — DX: Chronic sinusitis, unspecified: J32.9

## 2019-09-13 ENCOUNTER — Other Ambulatory Visit: Payer: Self-pay

## 2019-09-13 ENCOUNTER — Encounter
Admission: RE | Admit: 2019-09-13 | Discharge: 2019-09-13 | Disposition: A | Discharge status: Home / Self Care | Payer: BC Managed Care – PPO | Source: Ambulatory Visit | Attending: Orthopedic Surgery | Admitting: Orthopedic Surgery

## 2019-09-13 DIAGNOSIS — Z01812 Encounter for preprocedural laboratory examination: Secondary | ICD-10-CM | POA: Diagnosis not present

## 2019-09-13 HISTORY — DX: Gastro-esophageal reflux disease without esophagitis: K21.9

## 2019-09-13 HISTORY — DX: Unspecified osteoarthritis, unspecified site: M19.90

## 2019-09-13 NOTE — H&P (Signed)
ORTHOPAEDIC HISTORY & PHYSICAL Progress Notes Lisa Skinner, [name]., MD - 08/27/2019 11:45 AM EDT  Chief Complaint: Chief Complaint  Patient presents with  . Wrist Pain  Left wrist ganglion, left thumb cyst   Reason for Visit: The patient is a 59 y.o. right-hand-dominant female who presents today with her husband for reevaluation of her left hand and wrist. She reports a 1 year history of a cystic lesion to the volar surface of the left wrist. She does not recall any specific trauma or aggravating event. She has had the lesion aspirated, but it recurred. She has some mild tenderness to the site. More recently she has appreciated a nodular swelling along the radial aspect of the left thumb IP joint. She does have some tenderness to that site. She denies any triggering of the digit or any loss of motion. She denies any numbness.  Medications: Current Outpatient Medications  Medication Sig Dispense Refill  . amoxicillin (AMOXIL) 875 MG tablet Take 1 tablet (875 mg total) by mouth every 12 (twelve) hours for 7 days 14 tablet 0  . estradiol (VIVELLE-DOT) patch 0.05 mg/24 hr Place 1 patch onto the skin twice a week 24 patch 3  . etodolac (LODINE) 400 MG tablet Take 1 tablet by mouth 2 (two) times daily as needed  . fexofenadine (ALLEGRA) 180 MG tablet Take 1 tablet by mouth once daily  . fluticasone propionate (FLONASE) 50 mcg/actuation nasal spray Place 1 spray into both nostrils once daily  . Lacto.acidophilus-Bif.animalis 32 billion cell Cap Take 1 capsule by mouth once daily  . levothyroxine (SYNTHROID) 88 MCG tablet Take 1 tablet (88 mcg total) by mouth every morning before breakfast 90 tablet 3  . multivit-min/iron/folic/lutein (CENTRUM SILVER WOMEN ORAL) Take 1 tablet by mouth once daily  . omega-3 fatty acids-fish oil 300-1,000 mg capsule Take 2 g by mouth once daily   No current facility-administered medications for this visit.   Allergies: Allergies  Allergen Reactions  .  Codeine Nausea, Palpitations, Abdominal Pain and Dizziness  BP spike   . Cephalexin Other (See Comments)  GI Upset   Past Medical History: Past Medical History:  Diagnosis Date  . Acquired hypothyroidism, unspecified 04/08/2019  . Chicken pox  . History of mumps  . Lumbar disc disease 04/08/2019  Postop L4-5, 11/20, [place]  . Thyroid disease   Past Surgical History: Past Surgical History:  Procedure Laterality Date  . ABDOMINAL HYSTERECTOMY 2003  . Bilateral L4-5 hemilaminectomy and discectomy 01/14/2019  Dr Deetta Perla at [facility]  . DILATION AND CURETTAGE, DIAGNOSTIC / THERAPEUTIC 1994  . HYSTERECTOMY 2003  Total Hysterectomy  . KNEE ARTHROSCOPY Left 1990's  . [place]   Social History: Social History   Socioeconomic History  . Marital status: Married  Spouse name: Barbaraann Rondo  . Number of children: 0  . Years of education: 15  . Highest education level: Not on file  Occupational History  . Occupation: Full-time- Data processing manager  Tobacco Use  . Smoking status: Never Smoker  . Smokeless tobacco: Never Used  Substance and Sexual Activity  . Alcohol use: Never  . Drug use: Never  . Sexual activity: Yes  Partners: Male  Birth control/protection: None  Comment: Total Hysterectomy, 2003  Other Topics Concern  . Not on file  Social History Narrative  . Not on file   Social Determinants of Health   Financial Resource Strain:  . Difficulty of Paying Living Expenses:  Food Insecurity:  . Worried About Charity fundraiser in  the Last Year:  . [place] in the Last Year:  Transportation Needs:  . Film/video editor (Medical):  Marland Kitchen Lack of Transportation (Non-Medical):  Physical Activity:  . Days of Exercise per Week:  . Minutes of Exercise per Session:  Stress:  . Feeling of Stress :  Social Connections:  . Frequency of Communication with Friends and Family:  . Frequency of Social Gatherings with Friends and Family:  . Attends  Religious Services:  . Active Member of Clubs or Organizations:  . Attends Archivist Meetings:  Marland Kitchen Marital Status:   Family History: Family History  Problem Relation Age of Onset  . Alcohol abuse Father  . Anxiety Father  . High blood pressure (Hypertension) Father  . High blood pressure (Hypertension) Mother  . Emphysema Mother  . Pacemaker Mother   Review of Systems: A comprehensive 14 point ROS was performed, reviewed, and the pertinent orthopaedic findings are documented in the HPI.  Exam BP 124/86  Temp 36.3 C (97.3 F)  Ht 162.6 cm (5\' 4" )  Wt 63.4 kg (139 lb 12.8 oz)  BMI 24.00 kg/m   General:  Well-developed, well-nourished female seen in no acute distress.   HEENT:  Atraumatic, normocephalic. Pupils are equal and reactive to light. Extraocular motion is intact. Sclera are clear. Oropharynx is clear with moist mucosa.  Neck: Good range of motion. No tenderness to palpation. Spurling`s test is negative.  Lungs:  Clear to auscultation bilaterally.  Cardiovascular:  Regular rate and rhythm. Normal S1, S2. No murmur . No appreciable gallops or rubs. Peripheral pulses are palpable.   Extremities:  Normal shoulder contour.  Good range of motion and stability of the shoulders, elbows, and wrists. Tinel`s test at the elbow is negative.  Left hand:  Tenderness: Negative Erythema: negative Swelling: There is a nodular mass along the radial aspect of the volar surface of the wrist. The mass is nonpulsatile. There is mild tenderness to palpation. Also of note is a nodular lesion to the radial aspect of the left thumb IP joint. Mild tenderness to palpation is noted. Capillary Refill: normal Thenar atrophy: negative Intrinsic wasting: negative Grip strength: good grip strength Pincer strength: good pincer strength Tinel`s test: negative Phalen`s test: negative Triggering: No gross triggering or locking of the digits Finkelstein`s test: negative Range  of motion: Good range of motion of the digits  Neurologic:  Awake, alert , and oriented.  Sensory function is intact to pinprick and light touch. Motor strength is judged to be 5/5 except as noted above. No clonus or tremor.  Motor coordination is within normal limits.  Impression: Ganglion cyst to the volar surface of the left wrist Probable mucous cyst to the left thumb IP joint  Plan:  The findings were discussed in detail with the patient. Conservative treatment options were reviewed with the patient. We discussed the risks and benefits of surgical intervention. The usual perioperative course was also discussed in detail. The patient expressed understanding of the risks and benefits of surgical intervention and would like to proceed with plans for excision of the left wrist cyst and left thumb lesion.  MEDICAL CLEARANCE: Per anesthesiology ACTIVITIES: As tolerated. WORK STATUS: I anticipate the patient will be able to return to her work duties several days postoperatively. THERAPY: None MEDICATIONS: Requested Prescriptions   No prescriptions requested or ordered in this encounter   FOLLOW-UP: Return for postoperative follow-up.  Eiman Maret P. Holley Bouche., M.D.   Electronically signed by Lamar Benes.,  MD at 08/31/2019 5:51 PM EDT

## 2019-09-13 NOTE — Patient Instructions (Addendum)
Your procedure is scheduled on: 7-14-21WEDNESDAY Report to Same Day Surgery 2nd floor medical mall [facility] Entrance-take elevator on left to 2nd floor.  Check in with surgery information desk.) To find out your arrival time please call [phone] between 1PM - 3PM on 09-17-19 TUESDAY  Remember: Instructions that are not followed completely may result in serious medical risk, up to and including death, or upon the discretion of your surgeon and anesthesiologist your surgery may need to be rescheduled.    _x___ 1. Do not eat food after midnight the night before your procedure. NO GUM OR CANDY AFTER MIDNIGHT. You may drink clear liquids up to 2 hours before you are scheduled to arrive at the hospital for your procedure.  Do not drink clear liquids within 2 hours of your scheduled arrival to the hospital.  Clear liquids include  --Water or Apple juice without pulp  --Gatorade  --Black Coffee or Clear Tea (No milk, no creamers, do not add anything to the coffee or Tea-OK TO ADD SUGAR)  _X___Ensure clear carbohydrate drink-FINISH DRINK 2 HOURS PRIOR TO ARRIVAL TIME TO HOSPITAL THE DAY OF YOUR SURGERY     __x__ 2. No Alcohol for 24 hours before or after surgery.   __x__3. No Smoking or e-cigarettes for 24 prior to surgery.  Do not use any chewable tobacco products for at least 6 hour prior to surgery   ____  4. Bring all medications with you on the day of surgery if instructed.    __x__ 5. Notify your doctor if there is any change in your medical condition     (cold, fever, infections).    x___6. On the morning of surgery brush your teeth with toothpaste and water.  You may rinse your mouth with mouth wash if you wish.  Do not swallow any toothpaste or mouthwash.   Do not wear jewelry, make-up, hairpins, clips or nail polish.  Do not wear lotions, powders, or perfumes.   Do not shave 48 hours prior to surgery. Men may shave face and neck.  Do not bring valuables to the hospital.     [facility] is not responsible for any belongings or valuables.               Contacts, dentures or bridgework may not be worn into surgery.  Leave your suitcase in the car. After surgery it may be brought to your room.  For patients admitted to the hospital, discharge time is determined by your treatment team.  _  Patients discharged the day of surgery will not be allowed to drive home.  You will need someone to drive you home and stay with you the night of your procedure.    Please read over the following fact sheets that you were given:   [facility] Preparing for Surgery/INCENTIVE SPIROMETER INSTRUCTIONS  _x___ TAKE THE FOLLOWING MEDICATION THE MORNING OF SURGERY WITH SMALL SIP OF WATER. These include:  1. SYNTHROID (LEVOTHYROXINE)  2. DEXILANT (DEXLANSOPRAZOLE)  3. TAKE A DEXILANT TUESDAY NIGHT BEFORE BED  4.  5.  6.  ____Fleets enema or Magnesium Citrate as directed.   _x___ Use CHG Soap or sage wipes as directed on instruction sheet   ____ Use inhalers on the day of surgery and bring to hospital day of surgery  ____ Stop Metformin and Janumet 2 days prior to surgery.    ____ Take 1/2 of usual insulin dose the night before surgery and none on the morning surgery.   ____ Follow  recommendations from Cardiologist, Pulmonologist or PCP regarding  stopping Aspirin, Coumadin, Plavix ,Eliquis, Effient, or Pradaxa, and Pletal.  X____Stop Anti-inflammatories such as Advil, Aleve, Ibuprofen, Motrin, Naproxen, Naprosyn, Goodies powders or aspirin products NOW-OK to take Tylenol    _x___ Stop supplements until after surgery-STOP YOUR TEAR SUPPORT PLUS NOW-YOU MAY RESUME THIS AFTER YOUR SURGERY   ____ Bring C-Pap to the hospital.

## 2019-09-16 ENCOUNTER — Other Ambulatory Visit
Admission: RE | Admit: 2019-09-16 | Discharge: 2019-09-16 | Disposition: A | Discharge status: Home / Self Care | Payer: BC Managed Care – PPO | Source: Ambulatory Visit | Attending: Orthopedic Surgery | Admitting: Orthopedic Surgery

## 2019-09-16 ENCOUNTER — Other Ambulatory Visit: Payer: Self-pay

## 2019-09-16 DIAGNOSIS — Z20822 Contact with and (suspected) exposure to covid-19: Secondary | ICD-10-CM | POA: Diagnosis not present

## 2019-09-16 DIAGNOSIS — Z01812 Encounter for preprocedural laboratory examination: Secondary | ICD-10-CM | POA: Diagnosis present

## 2019-09-16 LAB — SARS CORONAVIRUS 2 (TAT 6-24 HRS): SARS Coronavirus 2: NEGATIVE

## 2019-09-17 ENCOUNTER — Encounter: Payer: Self-pay | Admitting: Orthopedic Surgery

## 2019-09-18 ENCOUNTER — Encounter
Admission: RE | Disposition: A | Discharge status: Home / Self Care | Payer: Self-pay | Source: Home / Self Care | Attending: Orthopedic Surgery

## 2019-09-18 ENCOUNTER — Ambulatory Visit: Payer: BC Managed Care – PPO | Admitting: Anesthesiology

## 2019-09-18 ENCOUNTER — Encounter: Payer: Self-pay | Admitting: Orthopedic Surgery

## 2019-09-18 ENCOUNTER — Ambulatory Visit
Admission: RE | Admit: 2019-09-18 | Discharge: 2019-09-18 | Disposition: A | Discharge status: Home / Self Care | Payer: BC Managed Care – PPO | Attending: Orthopedic Surgery | Admitting: Orthopedic Surgery

## 2019-09-18 ENCOUNTER — Ambulatory Visit: Payer: BC Managed Care – PPO | Admitting: Obstetrics & Gynecology

## 2019-09-18 ENCOUNTER — Other Ambulatory Visit: Payer: Self-pay

## 2019-09-18 DIAGNOSIS — M67432 Ganglion, left wrist: Secondary | ICD-10-CM | POA: Insufficient documentation

## 2019-09-18 DIAGNOSIS — Z79890 Hormone replacement therapy (postmenopausal): Secondary | ICD-10-CM | POA: Insufficient documentation

## 2019-09-18 DIAGNOSIS — Z79899 Other long term (current) drug therapy: Secondary | ICD-10-CM | POA: Insufficient documentation

## 2019-09-18 DIAGNOSIS — E039 Hypothyroidism, unspecified: Secondary | ICD-10-CM | POA: Diagnosis not present

## 2019-09-18 DIAGNOSIS — Z885 Allergy status to narcotic agent status: Secondary | ICD-10-CM | POA: Insufficient documentation

## 2019-09-18 DIAGNOSIS — Z791 Long term (current) use of non-steroidal anti-inflammatories (NSAID): Secondary | ICD-10-CM | POA: Diagnosis not present

## 2019-09-18 DIAGNOSIS — Z881 Allergy status to other antibiotic agents status: Secondary | ICD-10-CM | POA: Insufficient documentation

## 2019-09-18 DIAGNOSIS — M67442 Ganglion, left hand: Secondary | ICD-10-CM | POA: Insufficient documentation

## 2019-09-18 DIAGNOSIS — Z9889 Other specified postprocedural states: Secondary | ICD-10-CM

## 2019-09-18 HISTORY — PX: GANGLION CYST EXCISION: SHX1691

## 2019-09-18 SURGERY — EXCISION, GANGLION CYST, WRIST
Anesthesia: General | Site: Wrist | Laterality: Left

## 2019-09-18 MED ORDER — PROPOFOL 10 MG/ML IV BOLUS
INTRAVENOUS | Status: DC | PRN
  Administered 2019-09-18: 100 mg via INTRAVENOUS
  Administered 2019-09-18: 150 mg via INTRAVENOUS
  Administered 2019-09-18: 50 mg via INTRAVENOUS

## 2019-09-18 MED ORDER — FENTANYL CITRATE (PF) 100 MCG/2ML IJ SOLN: 25.0000 ug | INTRAMUSCULAR | Status: DC | PRN

## 2019-09-18 MED ORDER — DEXMEDETOMIDINE HCL IN NACL 400 MCG/100ML IV SOLN
INTRAVENOUS | Status: DC | PRN
  Administered 2019-09-18: 4 ug via INTRAVENOUS
  Administered 2019-09-18: 8 ug via INTRAVENOUS

## 2019-09-18 MED ORDER — CELECOXIB 200 MG PO CAPS
ORAL_CAPSULE | ORAL | Status: AC
  Administered 2019-09-18: 400 mg via ORAL
  Filled 2019-09-18: qty 2

## 2019-09-18 MED ORDER — DEXAMETHASONE SODIUM PHOSPHATE 10 MG/ML IJ SOLN
INTRAMUSCULAR | Status: AC
  Filled 2019-09-18: qty 1

## 2019-09-18 MED ORDER — METOCLOPRAMIDE HCL 10 MG PO TABS
5.0000 mg | ORAL_TABLET | Freq: Three times a day (TID) | ORAL | Status: DC | PRN
Start: 2019-09-18 — End: 2019-09-19

## 2019-09-18 MED ORDER — CELECOXIB 200 MG PO CAPS: 400.0000 mg | ORAL_CAPSULE | Freq: Once | ORAL | Status: AC

## 2019-09-18 MED ORDER — ORAL CARE MOUTH RINSE: 15.0000 mL | Freq: Once | OROMUCOSAL | Status: AC

## 2019-09-18 MED ORDER — ACETAMINOPHEN 10 MG/ML IV SOLN
INTRAVENOUS | Status: AC
  Filled 2019-09-18: qty 100

## 2019-09-18 MED ORDER — SUCCINYLCHOLINE CHLORIDE 20 MG/ML IJ SOLN
INTRAMUSCULAR | Status: DC | PRN
  Administered 2019-09-18: 80 mg via INTRAVENOUS

## 2019-09-18 MED ORDER — ONDANSETRON HCL 4 MG/2ML IJ SOLN
INTRAMUSCULAR | Status: AC
  Filled 2019-09-18: qty 2

## 2019-09-18 MED ORDER — ONDANSETRON HCL 4 MG/2ML IJ SOLN: 4.0000 mg | Freq: Once | INTRAMUSCULAR | Status: DC | PRN

## 2019-09-18 MED ORDER — SUCCINYLCHOLINE CHLORIDE 200 MG/10ML IV SOSY
PREFILLED_SYRINGE | INTRAVENOUS | Status: AC
  Filled 2019-09-18: qty 10

## 2019-09-18 MED ORDER — BUPIVACAINE HCL (PF) 0.25 % IJ SOLN
INTRAMUSCULAR | Status: AC
  Filled 2019-09-18: qty 30

## 2019-09-18 MED ORDER — TRAMADOL HCL 50 MG PO TABS: 50.0000 mg | ORAL_TABLET | Freq: Four times a day (QID) | ORAL | 0 refills | Status: DC | PRN

## 2019-09-18 MED ORDER — EPHEDRINE SULFATE 50 MG/ML IJ SOLN
INTRAMUSCULAR | Status: DC | PRN
  Administered 2019-09-18: 5 mg via INTRAVENOUS
  Administered 2019-09-18: 7.5 mg via INTRAVENOUS
  Administered 2019-09-18: 10 mg via INTRAVENOUS

## 2019-09-18 MED ORDER — ACETAMINOPHEN 10 MG/ML IV SOLN
INTRAVENOUS | Status: DC | PRN
  Administered 2019-09-18: 1000 mg via INTRAVENOUS

## 2019-09-18 MED ORDER — BUPIVACAINE HCL (PF) 0.25 % IJ SOLN
INTRAMUSCULAR | Status: DC | PRN
  Administered 2019-09-18: 10 mL

## 2019-09-18 MED ORDER — ONDANSETRON HCL 4 MG/2ML IJ SOLN
INTRAMUSCULAR | Status: DC | PRN
  Administered 2019-09-18: 4 mg via INTRAVENOUS

## 2019-09-18 MED ORDER — MIDAZOLAM HCL 2 MG/2ML IJ SOLN
INTRAMUSCULAR | Status: DC | PRN
  Administered 2019-09-18: 2 mg via INTRAVENOUS

## 2019-09-18 MED ORDER — PROPOFOL 10 MG/ML IV BOLUS
INTRAVENOUS | Status: AC
  Filled 2019-09-18: qty 20

## 2019-09-18 MED ORDER — CHLORHEXIDINE GLUCONATE 0.12 % MT SOLN
OROMUCOSAL | Status: AC
  Administered 2019-09-18: 15 mL via OROMUCOSAL
  Filled 2019-09-18: qty 15

## 2019-09-18 MED ORDER — MIDAZOLAM HCL 2 MG/2ML IJ SOLN
INTRAMUSCULAR | Status: AC
  Filled 2019-09-18: qty 2

## 2019-09-18 MED ORDER — LIDOCAINE HCL (CARDIAC) PF 100 MG/5ML IV SOSY
PREFILLED_SYRINGE | INTRAVENOUS | Status: DC | PRN
  Administered 2019-09-18: 80 mg via INTRAVENOUS

## 2019-09-18 MED ORDER — FENTANYL CITRATE (PF) 100 MCG/2ML IJ SOLN
INTRAMUSCULAR | Status: DC | PRN
  Administered 2019-09-18 (×2): 50 ug via INTRAVENOUS

## 2019-09-18 MED ORDER — ACETAMINOPHEN 10 MG/ML IV SOLN: 1000.0000 mg | Freq: Once | INTRAVENOUS | Status: DC | PRN

## 2019-09-18 MED ORDER — EPHEDRINE 5 MG/ML INJ
INTRAVENOUS | Status: AC
  Filled 2019-09-18: qty 10

## 2019-09-18 MED ORDER — FENTANYL CITRATE (PF) 100 MCG/2ML IJ SOLN
INTRAMUSCULAR | Status: AC
  Filled 2019-09-18: qty 2

## 2019-09-18 MED ORDER — METOCLOPRAMIDE HCL 5 MG/ML IJ SOLN
5.0000 mg | Freq: Three times a day (TID) | INTRAMUSCULAR | Status: DC | PRN
Start: 2019-09-18 — End: 2019-09-19

## 2019-09-18 MED ORDER — DEXAMETHASONE SODIUM PHOSPHATE 10 MG/ML IJ SOLN
INTRAMUSCULAR | Status: DC | PRN
  Administered 2019-09-18: 10 mg via INTRAVENOUS

## 2019-09-18 MED ORDER — ONDANSETRON HCL 4 MG/2ML IJ SOLN: 4.0000 mg | Freq: Four times a day (QID) | INTRAMUSCULAR | Status: DC | PRN

## 2019-09-18 MED ORDER — ONDANSETRON HCL 4 MG PO TABS
4.0000 mg | ORAL_TABLET | Freq: Four times a day (QID) | ORAL | Status: DC | PRN
Start: 2019-09-18 — End: 2019-09-19

## 2019-09-18 MED ORDER — LIDOCAINE HCL (PF) 2 % IJ SOLN
INTRAMUSCULAR | Status: AC
  Filled 2019-09-18: qty 5

## 2019-09-18 MED ORDER — CHLORHEXIDINE GLUCONATE 0.12 % MT SOLN: 15.0000 mL | Freq: Once | OROMUCOSAL | Status: AC

## 2019-09-18 MED ORDER — LACTATED RINGERS IV SOLN: INTRAVENOUS | Status: DC

## 2019-09-18 SURGICAL SUPPLY — 26 items
BNDG ESMARK 4X12 TAN STRL LF (GAUZE/BANDAGES/DRESSINGS) ×3 IMPLANT
BNDG STRETCH 4X75 STRL LF (GAUZE/BANDAGES/DRESSINGS) ×3 IMPLANT
CANISTER SUCT 1200ML W/VALVE (MISCELLANEOUS) ×3 IMPLANT
CLOSURE WOUND 1/2 X4 (GAUZE/BANDAGES/DRESSINGS) ×1
COVER WAND RF STERILE (DRAPES) ×3 IMPLANT
CUFF TOURN SGL QUICK 12 (TOURNIQUET CUFF) ×2 IMPLANT
CUFF TOURN SGL QUICK 18X4 (TOURNIQUET CUFF) ×3 IMPLANT
DRSG DERMACEA 8X12 NADH (GAUZE/BANDAGES/DRESSINGS) ×3 IMPLANT
DURAPREP 26ML APPLICATOR (WOUND CARE) ×3 IMPLANT
ELECT REM PT RETURN 9FT ADLT (ELECTROSURGICAL) ×3
ELECTRODE REM PT RTRN 9FT ADLT (ELECTROSURGICAL) ×1 IMPLANT
GAUZE SPONGE 4X4 12PLY STRL (GAUZE/BANDAGES/DRESSINGS) ×3 IMPLANT
GLOVE BIO SURGEON STRL SZ8 (GLOVE) ×3 IMPLANT
GLOVE BIOGEL M STRL SZ7.5 (GLOVE) ×3 IMPLANT
GOWN STRL REUS W/ TWL LRG LVL3 (GOWN DISPOSABLE) ×2 IMPLANT
GOWN STRL REUS W/TWL LRG LVL3 (GOWN DISPOSABLE) ×6
KIT TURNOVER KIT A (KITS) ×3 IMPLANT
NS IRRIG 500ML POUR BTL (IV SOLUTION) ×3 IMPLANT
PACK EXTREMITY (MISCELLANEOUS) ×3 IMPLANT
SOL PREP PVP 2OZ (MISCELLANEOUS) ×3
SOLUTION PREP PVP 2OZ (MISCELLANEOUS) ×1 IMPLANT
STOCKINETTE BIAS CUT 4 980044 (GAUZE/BANDAGES/DRESSINGS) ×3 IMPLANT
STRIP CLOSURE SKIN 1/2X4 (GAUZE/BANDAGES/DRESSINGS) ×2 IMPLANT
SUT VIC AB 2-0 SH 27 (SUTURE) ×3
SUT VIC AB 2-0 SH 27XBRD (SUTURE) ×1 IMPLANT
SUT VIC AB 3-0 PS2 18 (SUTURE) ×3 IMPLANT

## 2019-09-18 NOTE — H&P (Signed)
The patient has been re-examined, and the chart reviewed, and there have been no interval changes to the documented history and physical.    The risks, benefits, and alternatives have been discussed at length. The patient expressed understanding of the risks benefits and agreed with plans for surgical intervention.  Jin Capote P. Charity Tessier, Jr. M.D.    

## 2019-09-18 NOTE — Op Note (Signed)
OPERATIVE NOTE  DATE OF SURGERY:  09/18/2019  PATIENT NAME:  Lisa Skinner   DOB: [birth year]  MRN: [id number]   PRE-OPERATIVE DIAGNOSIS:   Volar ganglion cyst of the left wrist Cystic mass to the left thumb  POST-OPERATIVE DIAGNOSIS:  Same  PROCEDURE:   Excision of volar ganglion cyst from the left wrist Excision of cystic mass from the left thumb  SURGEON:  Marciano Sequin., M.D.   ANESTHESIA: general  ESTIMATED BLOOD LOSS: 2 mL  FLUIDS REPLACED: 1000 mL of crystalloid  TOURNIQUET TIME: 25 minutes  INDICATIONS FOR SURGERY: Lisa Skinner is a 59 y.o. year old female who has been seen for complaints of a cystic mass to the volar surface of the left wrist and a cystic mass to the extensor surface of the left thumb. After discussion of the risks and benefits of surgical intervention, the patient expressed understanding of the risks benefits and agree with plans for excision of the cystic mass from the left wrist and left thumb.Marland Kitchen   PROCEDURE IN DETAIL: The patient was brought into the operating room and, after adequate general endotracheal anesthesia was achieved, a tourniquet was placed on the patient's upper left arm.  The patient's left hand and arm were cleaned and prepped with alcohol and DuraPrep and draped in usual sterile fashion.  A "timeout" was performed as per usual protocol.  Loupe magnification was used throughout the procedure.  The left upper extremity was exsanguinated using Esmarch, tourniquet was inflated to 250 mmHg.  There was a cystic lesion noted to the volar surface of the wrist just ulnar to the radial artery.  A transverse incision was made directly over the mass and careful blunt dissection was carried out around the cystic lesion.  The cyst measured approximately 7 mm in diameter.  The stalk of the cyst was identified and incised in the cyst was removed and submitted for pathology.  Electrocautery was used to seal the stalk.  Next, attention was directed to the  left thumb.  There was a cystic mass noted to the extensor surface of the thumb at the IP joint skin crease.  A transverse incision was made directly over the mass.  Careful dissection was carried out around the mass using tenotomy scissors.  The mass was excised from the extensor surface and submitted to pathology.  The lesion measured approximately 2 to 3 mm at its largest dimension.  The tourniquet was then deflated after total tourniquet time of 25 minutes.  Hemostasis was achieved using electrocautery.  The wounds were irrigated with copious amounts of normal saline with antibiotic solution.  The incisions were closed using #5 nylon.  A total of 4 mL of 0.25% Marcaine was injected along the incision sites.  A sterile dressing was applied followed by application of a wrist splint.  The patient tolerated the procedure well.  She was transported to the recovery room in stable condition.   Lisa Skinner M.D.

## 2019-09-18 NOTE — Discharge Instructions (Signed)
AMBULATORY SURGERY  DISCHARGE INSTRUCTIONS   1) The drugs that you were given will stay in your system until tomorrow so for the next 24 hours you should not:  A) Drive an automobile B) Make any legal decisions C) Drink any alcoholic beverage   2) You may resume regular meals tomorrow.  Today it is better to start with liquids and gradually work up to solid foods.  You may eat anything you prefer, but it is better to start with liquids, then soup and crackers, and gradually work up to solid foods.   3) Please notify your doctor immediately if you have any unusual bleeding, trouble breathing, redness and pain at the surgery site, drainage, fever, or pain not relieved by medication. 4)   5) Your post-operative visit with Dr.                                     is: Date:                        Time:    Please call to schedule your post-operative visit.  6) Additional Instructions:      Instructions after Hand / Wrist Surgery   James P. Holley Bouche., M.D.  Dept. of [facility]  [address], [place]  [address]   Phone: [phone]   Fax: [phone]   DIET: . Drink plenty of non-alcoholic fluids & begin a light diet. Marland Kitchen Resume your normal diet the day after surgery.  ACTIVITY:  . Keep the hand elevated above the level of the elbow. . Begin gently moving the fingers on a regular basis to avoid stiffness. . Avoid any heavy lifting, pushing, or pulling with the operative hand. . Do not drive or operate any equipment until instructed.  WOUND CARE:  . Keep the splint/bandage clean and dry.  . The splint and stitches will be removed in the office. . Continue to use the ice packs periodically to reduce pain and swelling. . You may bathe or shower after the stitches are removed at the first office visit following surgery.  MEDICATIONS: . Dennis Bast may resume your regular medications. . Please take the pain medication as  prescribed. . Do not take pain medication on an empty stomach. . Do not drive or drink alcoholic beverages when taking pain medications.  CALL THE OFFICE FOR: . Temperature above 101 degrees . Excessive bleeding or drainage on the dressing. . Excessive swelling, coldness, or paleness of the fingers. . Persistent nausea and vomiting.  FOLLOW-UP:  . You should have an appointment to return to the office in 7-10 days after surgery.   REMEMBER: R.I.C.E. = Rest, Ice, Compression, Elevation !    [facility] Department Directory         [email or web address]       [email or web address]          Cardiology  Appointments: [place] [phone]  Endocrinology  Appointments: [place] [phone] [place] [phone]  Gastroenterology  Appointments: [place] [phone] [place] [phone]        General Surgery   Appointments: [facility]  Internal Medicine/Family Medicine  Appointments: [place] - [phone] [place] - [phone]  Metabolic and [place] Loss Surgery  Appointments: [place]        Neurology  Appointments: [place] [phone]  [place] - [phone]  Neurosurgery  Appointments: [place]  Obstetrics & Gynecology  Appointments: [place] [phone] [place] - [phone]        Pediatrics  Appointments: [place] [phone] [place]  Physiatry  Appointments: [place] [phone]  Physical Therapy  Appointments: [place] [phone]        Podiatry  Appointments: [place] [phone] [place] - [phone]  Pulmonology  Appointments: [place]  Rheumatology  Appointments: [place] [phone]        [place] Location: [facility]  [address] [place], [place]  [address]  [place] Location: [facility] [address] [place], [place]  [address]  [place] Location: [facility] [address] [place], [place]  [address]

## 2019-09-18 NOTE — Anesthesia Procedure Notes (Signed)
Procedure Name: Intubation Date/Time: 09/18/2019 4:22 PM Performed by: Jerrye Noble, CRNA Pre-anesthesia Checklist: Patient identified, Emergency Drugs available, Suction available and Patient being monitored Patient Re-evaluated:Patient Re-evaluated prior to induction Oxygen Delivery Method: Circle system utilized Preoxygenation: Pre-oxygenation with 100% oxygen Induction Type: IV induction Ventilation: Mask ventilation without difficulty Laryngoscope Size: Mac and 3 Grade View: Grade I Tube type: Oral Tube size: 7.0 mm Number of attempts: 1 Airway Equipment and Method: Stylet Placement Confirmation: ETT inserted through vocal cords under direct vision,  positive ETCO2 and breath sounds checked- equal and bilateral Secured at: 22 cm Tube secured with: Tape Dental Injury: Teeth and Oropharynx as per pre-operative assessment

## 2019-09-18 NOTE — Anesthesia Preprocedure Evaluation (Signed)
Anesthesia Evaluation  Patient identified by MRN, date of birth, ID band Patient awake    Reviewed: Allergy & Precautions, H&P , NPO status , Patient's Chart, lab work & pertinent test results  History of Anesthesia Complications Negative for: history of anesthetic complications  Airway Mallampati: II  TM Distance: >3 FB Neck ROM: Full    Dental no notable dental hx. (+) Teeth Intact, Dental Advisory Given   Pulmonary neg pulmonary ROS, neg sleep apnea, neg COPD, Patient abstained from smoking.Not current smoker,    Pulmonary exam normal breath sounds clear to auscultation       Cardiovascular Exercise Tolerance: Good METS(-) hypertension(-) CAD and (-) Past MI negative cardio ROS  (-) dysrhythmias  Rhythm:Regular Rate:Normal - Systolic murmurs    Neuro/Psych negative neurological ROS  negative psych ROS   GI/Hepatic Neg liver ROS, GERD  Controlled,  Endo/Other  neg diabetesHypothyroidism   Renal/GU negative Renal ROS     Musculoskeletal   Abdominal   Peds  Hematology negative hematology ROS (+)   Anesthesia Other Findings Past Medical History: No date: Allergic rhinitis 08/18/2016: Ankle sprain     Comment:  right 09/15/2015: History of mammogram     Comment:  neg No date: History of mumps No date: History of shingles No date: Hypothyroidism No date: Thyroid disease     Comment:  hypothyroid  Past Surgical History: 2003: ABDOMINAL HYSTERECTOMY     Comment:  with BSO multiple cyst; BVD 07/17/1992: DILATION AND CURETTAGE OF UTERUS 90S: KNEE ARTHROSCOPY     Comment:  LEFT 05/20/1994: LASIK     Reproductive/Obstetrics negative OB ROS                             Anesthesia Physical  Anesthesia Plan  ASA: II  Anesthesia Plan: General   Post-op Pain Management:    Induction: Intravenous  PONV Risk Score and Plan: 3 and Ondansetron, Dexamethasone, Midazolam and  Treatment may vary due to age or medical condition  Airway Management Planned: LMA  Additional Equipment: None  Intra-op Plan:   Post-operative Plan: Extubation in OR  Informed Consent: I have reviewed the patients History and Physical, chart, labs and discussed the procedure including the risks, benefits and alternatives for the proposed anesthesia with the patient or authorized representative who has indicated his/her understanding and acceptance.     Dental Advisory Given  Plan Discussed with: CRNA and Surgeon  Anesthesia Plan Comments: (Discussed risks of anesthesia with patient, including PONV, sore throat, lip/dental damage. Rare risks discussed as well, such as cardiorespiratory and neurological sequelae. Patient understands.)        Anesthesia Quick Evaluation

## 2019-09-18 NOTE — Transfer of Care (Signed)
Immediate Anesthesia Transfer of Care Note  Patient: Lisa Skinner  Procedure(s) Performed: excision of left wrist ganglion, excision of cyst from left thumb (Left Wrist)  Patient Location: PACU  Anesthesia Type:General  Level of Consciousness: awake and drowsy  Airway & Oxygen Therapy: Patient Spontanous Breathing and Patient connected to face mask oxygen  Post-op Assessment: Report given to RN and Post -op Vital signs reviewed and stable  Post vital signs: Reviewed and stable  Last Vitals:  Vitals Value Taken Time  BP 142/72 09/18/19 1750  Temp    Pulse 91 09/18/19 1753  Resp 21 09/18/19 1753  SpO2 100 % 09/18/19 1753  Vitals shown include unvalidated device data.  Last Pain:  Vitals:   09/18/19 1751  TempSrc:   PainSc: (P) Asleep         Complications: No complications documented.

## 2019-09-19 ENCOUNTER — Encounter: Payer: Self-pay | Admitting: Orthopedic Surgery

## 2019-09-20 ENCOUNTER — Telehealth: Payer: Self-pay

## 2019-09-20 LAB — SURGICAL PATHOLOGY

## 2019-09-20 NOTE — Telephone Encounter (Signed)
PC to pt.  Advised of the [employer] contaminated water concern.  The pt. Stated she became aware of this last evening.  Reassured pt. that the contamination was noted to be at a single location, and no water contamination has been detected at [facility].  Advised to call PCP if develops diarrhea, stomach cramping/ pain, or nausea and vomiting.  Pt. Verb. Understanding.

## 2019-09-30 NOTE — Anesthesia Postprocedure Evaluation (Signed)
Anesthesia Post Note  Patient: Lisa Skinner  Procedure(s) Performed: excision of left wrist ganglion, excision of cyst from left thumb (Left Wrist)  Patient location during evaluation: PACU Anesthesia Type: General Level of consciousness: awake and alert and oriented Pain management: pain level controlled Vital Signs Assessment: post-procedure vital signs reviewed and stable Respiratory status: spontaneous breathing Cardiovascular status: blood pressure returned to baseline Anesthetic complications: no   No complications documented.   Last Vitals:  Vitals:   09/18/19 1912 09/18/19 1922  BP: 133/74 121/81  Pulse: 71 74  Resp:    Temp: (!) 36.1 C   SpO2: 100% 100%    Last Pain:  Vitals:   09/19/19 1103  TempSrc:   PainSc: 0-No pain                 Sher Shampine

## 2019-10-01 ENCOUNTER — Other Ambulatory Visit: Payer: Self-pay | Admitting: Obstetrics & Gynecology

## 2019-10-01 DIAGNOSIS — Z13820 Encounter for screening for osteoporosis: Secondary | ICD-10-CM

## 2019-10-01 DIAGNOSIS — Z1231 Encounter for screening mammogram for malignant neoplasm of breast: Secondary | ICD-10-CM

## 2019-10-10 ENCOUNTER — Other Ambulatory Visit: Payer: Self-pay

## 2019-10-10 ENCOUNTER — Ambulatory Visit
Admission: RE | Admit: 2019-10-10 | Discharge: 2019-10-10 | Disposition: A | Discharge status: Home / Self Care | Payer: BC Managed Care – PPO | Source: Ambulatory Visit | Attending: Obstetrics & Gynecology | Admitting: Obstetrics & Gynecology

## 2019-10-10 DIAGNOSIS — Z13820 Encounter for screening for osteoporosis: Secondary | ICD-10-CM | POA: Diagnosis not present

## 2019-11-04 DIAGNOSIS — Z9889 Other specified postprocedural states: Secondary | ICD-10-CM | POA: Insufficient documentation

## 2019-11-12 ENCOUNTER — Other Ambulatory Visit: Payer: Self-pay | Admitting: Obstetrics & Gynecology

## 2019-11-12 DIAGNOSIS — R232 Flushing: Secondary | ICD-10-CM

## 2019-11-12 NOTE — Telephone Encounter (Signed)
Patient has transfer care for [facility] clinic for her GYN care due to insurance changes

## 2019-11-12 NOTE — Telephone Encounter (Signed)
Please schedule annual.

## 2019-11-12 NOTE — Telephone Encounter (Signed)
Sch annual.  Refill one time done

## 2019-11-15 ENCOUNTER — Other Ambulatory Visit: Payer: Self-pay | Admitting: Nurse Practitioner

## 2019-11-15 DIAGNOSIS — G8929 Other chronic pain: Secondary | ICD-10-CM

## 2019-12-03 ENCOUNTER — Ambulatory Visit
Admission: RE | Admit: 2019-12-03 | Discharge: 2019-12-03 | Disposition: A | Discharge status: Home / Self Care | Payer: BC Managed Care – PPO | Source: Ambulatory Visit | Attending: Nurse Practitioner | Admitting: Nurse Practitioner

## 2019-12-03 ENCOUNTER — Other Ambulatory Visit: Payer: Self-pay

## 2019-12-03 DIAGNOSIS — M5441 Lumbago with sciatica, right side: Secondary | ICD-10-CM | POA: Diagnosis present

## 2019-12-03 DIAGNOSIS — G8929 Other chronic pain: Secondary | ICD-10-CM | POA: Diagnosis present

## 2019-12-08 ENCOUNTER — Other Ambulatory Visit: Payer: Self-pay | Admitting: Family Medicine

## 2019-12-08 DIAGNOSIS — M5126 Other intervertebral disc displacement, lumbar region: Secondary | ICD-10-CM

## 2019-12-08 NOTE — Telephone Encounter (Signed)
Requested medication (s) are due for refill today: yes  Requested medication (s) are on the active medication list: NO  Last refill:  02/04/19  Future visit scheduled: no  Notes to clinic:  Prescription expired 09/04/19   Requested Prescriptions  Pending Prescriptions Disp Refills   etodolac (LODINE) 400 MG tablet [Pharmacy Med Name: ETODOLAC 400MG  TABLETS] 30 tablet 1    Sig: TAKE 1 TABLET(400 MG) BY MOUTH TWICE DAILY      Analgesics:  NSAIDS Failed - 12/08/2019  6:11 PM      Failed - HGB in normal range and within 360 days    Hemoglobin  Date Value Ref Range Status  01/10/2019 15.6 (H) 12.0 - 15.0 g/dL Final          Passed - Cr in normal range and within 360 days    Creatinine, Ser  Date Value Ref Range Status  01/10/2019 0.85 0.44 - 1.00 mg/dL Final          Passed - Patient is not pregnant      Passed - Valid encounter within last 12 months    Recent Outpatient Visits           9 months ago Acute non-recurrent frontal sinusitis   [place] [place], PA-C   10 months ago Acute ear pain, bilateral   [facility] Carles Collet M, [place]   11 months ago Upper respiratory tract infection, unspecified type   [facility] Birdie Sons, [place]   1 year ago Spinal stenosis of lumbar region without neurogenic claudication   [facility] Birdie Sons, [place]   1 year ago [place], [place], [place]

## 2020-01-17 ENCOUNTER — Ambulatory Visit: Payer: BC Managed Care – PPO | Attending: Internal Medicine

## 2020-01-17 ENCOUNTER — Other Ambulatory Visit: Payer: Self-pay | Admitting: Internal Medicine

## 2020-01-17 DIAGNOSIS — Z23 Encounter for immunization: Secondary | ICD-10-CM

## 2020-01-17 NOTE — Progress Notes (Signed)
   Covid-19 Vaccination Clinic  Name:  Lisa Skinner    MRN: [id number] DOB: [birth year]  01/17/2020  [name] was observed post Covid-19 immunization for 15 minutes without incident. She was provided with Vaccine Information Sheet and instruction to access the V-Safe system.   [name] was instructed to call 911 with any severe reactions post vaccine: Marland Kitchen Difficulty breathing  . Swelling of face and throat  . A fast heartbeat  . A bad rash all over body  . Dizziness and weakness

## 2020-02-20 ENCOUNTER — Ambulatory Visit (INDEPENDENT_AMBULATORY_CARE_PROVIDER_SITE_OTHER): Payer: BC Managed Care – PPO | Admitting: Dermatology

## 2020-02-20 ENCOUNTER — Other Ambulatory Visit: Payer: Self-pay

## 2020-02-20 DIAGNOSIS — L578 Other skin changes due to chronic exposure to nonionizing radiation: Secondary | ICD-10-CM

## 2020-02-20 DIAGNOSIS — L814 Other melanin hyperpigmentation: Secondary | ICD-10-CM | POA: Diagnosis not present

## 2020-02-20 DIAGNOSIS — L821 Other seborrheic keratosis: Secondary | ICD-10-CM | POA: Diagnosis not present

## 2020-02-20 DIAGNOSIS — D229 Melanocytic nevi, unspecified: Secondary | ICD-10-CM

## 2020-02-20 DIAGNOSIS — D489 Neoplasm of uncertain behavior, unspecified: Secondary | ICD-10-CM

## 2020-02-20 DIAGNOSIS — D2372 Other benign neoplasm of skin of left lower limb, including hip: Secondary | ICD-10-CM | POA: Diagnosis not present

## 2020-02-20 DIAGNOSIS — Z1283 Encounter for screening for malignant neoplasm of skin: Secondary | ICD-10-CM | POA: Diagnosis not present

## 2020-02-20 DIAGNOSIS — D485 Neoplasm of uncertain behavior of skin: Secondary | ICD-10-CM | POA: Diagnosis not present

## 2020-02-20 DIAGNOSIS — D1800 Hemangioma unspecified site: Secondary | ICD-10-CM

## 2020-02-20 NOTE — Patient Instructions (Addendum)
Cryotherapy Aftercare  . Wash gently with soap and water everyday.   Marland Kitchen Apply Vaseline and Band-Aid daily until healed.   Wound Care Instructions  1. Cleanse wound gently with soap and water once a day then pat dry with clean gauze. Apply a thing coat of Petrolatum (petroleum jelly, "Vaseline") over the wound (unless you have an allergy to this). We recommend that you use a new, sterile tube of Vaseline. Do not pick or remove scabs. Do not remove the yellow or white "healing tissue" from the base of the wound.  2. Cover the wound with fresh, clean, nonstick gauze and secure with paper tape. You may use Band-Aids in place of gauze and tape if the would is small enough, but would recommend trimming much of the tape off as there is often too much. Sometimes Band-Aids can irritate the skin.  3. You should call the office for your biopsy report after 1 week if you have not already been contacted.  4. If you experience any problems, such as abnormal amounts of bleeding, swelling, significant bruising, significant pain, or evidence of infection, please call the office immediately.  5. FOR ADULT SURGERY PATIENTS: If you need something for pain relief you may take 1 extra strength Tylenol (acetaminophen) AND 2 Ibuprofen (200mg  each) together every 4 hours as needed for pain. (do not take these if you are allergic to them or if you have a reason you should not take them.) Typically, you may only need pain medication for 1 to 3 days.   Melanoma ABCDEs  Melanoma is the most dangerous type of skin cancer, and is the leading cause of death from skin disease.  You are more likely to develop melanoma if you:  Have light-colored skin, light-colored eyes, or red or blond hair  Spend a lot of time in the sun  Tan regularly, either outdoors or in a tanning bed  Have had blistering sunburns, especially during childhood  Have a close family member who has had a melanoma  Have atypical moles or large  birthmarks  Early detection of melanoma is key since treatment is typically straightforward and cure rates are extremely high if we catch it early.   The first sign of melanoma is often a change in a mole or a new dark spot.  The ABCDE system is a way of remembering the signs of melanoma.  A for asymmetry:  The two halves do not match. B for border:  The edges of the growth are irregular. C for color:  A mixture of colors are present instead of an even brown color. D for diameter:  Melanomas are usually (but not always) greater than 65mm - the size of a pencil eraser. E for evolution:  The spot keeps changing in size, shape, and color.  Please check your skin once per month between visits. You can use a small mirror in front and a large mirror behind you to keep an eye on the back side or your body.   If you see any new or changing lesions before your next follow-up, please call to schedule a visit.  Please continue daily skin protection including broad spectrum sunscreen SPF 30+ to sun-exposed areas, reapplying every 2 hours as needed when you're outdoors.   Recommend taking Heliocare sun protection supplement daily in sunny weather for additional sun protection. For maximum protection on the sunniest days, you can take up to 2 capsules of regular Heliocare OR take 1 capsule of Heliocare Ultra. For prolonged  exposure (such as a full day in the sun), you can repeat your dose of the supplement 4 hours after your first dose. Heliocare can be purchased at [facility] or at [email or web address].

## 2020-02-20 NOTE — Progress Notes (Signed)
Follow-Up Visit   Subjective  Lisa Skinner is a 59 y.o. female who presents for the following: tbse (Patient here for full body skin exam and skin cancer screening. ). Patient states she is here for yearly TBSE today. She was here last year and seen provider for TBSE. Patient denies history of skin cancer and avoids tanning bed use.   Patient states she has some wisdom spots on left breast that she would like to have removed. She has some itchy spots on her leg as well that she would like treated if possible.  The following portions of the chart were reviewed this encounter and updated as appropriate:  Tobacco  Allergies  Meds  Problems  Med Hx  Surg Hx  Fam Hx      Objective  Well appearing patient in no apparent distress; mood and affect are within normal limits.    Objective  Left Breast: 0.4 cm  erythematous papule   Objective  left calf x 2: Firm erythema pink/brown papulenodules with dimple sign  Assessment & Plan  Neoplasm of uncertain behavior Left Breast  Skin / nail biopsy Type of biopsy: tangential   Informed consent: discussed and consent obtained   Patient was prepped and draped in usual sterile fashion: Area prepped with alcohol. Anesthesia: the lesion was anesthetized in a standard fashion   Anesthetic:  1% lidocaine w/ epinephrine 1-100,000 buffered w/ 8.4% NaHCO3 Instrument used: DermaBlade   Hemostasis achieved with: pressure, aluminum chloride and electrodesiccation   Outcome: patient tolerated procedure well   Post-procedure details: wound care instructions given   Post-procedure details comment:  Ointment and small bandage applied  Specimen 1 - Surgical pathology Differential Diagnosis: r/o ISK Check Margins: No 0.4 cm erythematous papule  Other benign neoplasm of skin of left lower limb, including hip left calf x 2  Favor dermatofibroma  Symptomatic per patient itchy  Prior to procedure, discussed risks of blister formation, small  wound, skin dyspigmentation, or rare scar following cryotherapy.   Destruction of lesion - left calf x 2 Complexity: simple   Destruction method: cryotherapy   Informed consent: discussed and consent obtained   Lesion destroyed using liquid nitrogen: Yes   Region frozen until ice ball extended beyond lesion: Yes   Cryotherapy cycles:  2 Outcome: patient tolerated procedure well with no complications   Post-procedure details: wound care instructions given    Lentigines - Scattered tan macules - Discussed due to sun exposure - Benign, observe - Call for any changes  Seborrheic Keratoses - Stuck-on, waxy, tan-brown papules and plaques  - Discussed benign etiology and prognosis. - Observe - Call for any changes  Melanocytic Nevi - Tan-brown and/or pink-flesh-colored symmetric macules and papules - Benign appearing on exam today - Observation - Call clinic for new or changing moles - Recommend daily use of broad spectrum spf 30+ sunscreen to sun-exposed areas.   Hemangiomas - Red papules - Discussed benign nature - Observe - Call for any changes  Actinic Damage - Chronic, secondary to cumulative UV/sun exposure - diffuse scaly erythematous macules with underlying dyspigmentation - Recommend daily broad spectrum sunscreen SPF 30+ to sun-exposed areas, reapply every 2 hours as needed.  - Call for new or changing lesions.  Skin cancer screening performed today. Return in about 1 month (around 03/22/2020) for TBSE or as needed .  I, Ruthell Rummage, CMA, am acting as scribe for Forest Gleason, MD.  Documentation: I have reviewed the above documentation for accuracy and completeness, and  I agree with the above.  Forest Gleason, MD

## 2020-02-25 ENCOUNTER — Encounter: Payer: Self-pay | Admitting: Dermatology

## 2020-06-05 ENCOUNTER — Other Ambulatory Visit: Payer: Self-pay

## 2020-06-05 ENCOUNTER — Ambulatory Visit
Admission: RE | Admit: 2020-06-05 | Discharge: 2020-06-05 | Disposition: A | Discharge status: Home / Self Care | Payer: BC Managed Care – PPO | Source: Ambulatory Visit | Attending: Obstetrics & Gynecology | Admitting: Obstetrics & Gynecology

## 2020-06-05 DIAGNOSIS — Z1231 Encounter for screening mammogram for malignant neoplasm of breast: Secondary | ICD-10-CM | POA: Diagnosis not present

## 2020-06-19 ENCOUNTER — Ambulatory Visit: Payer: BC Managed Care – PPO | Attending: Internal Medicine

## 2020-06-19 ENCOUNTER — Other Ambulatory Visit: Payer: Self-pay

## 2020-06-19 DIAGNOSIS — Z23 Encounter for immunization: Secondary | ICD-10-CM

## 2020-06-19 MED ORDER — MODERNA COVID-19 VACCINE 100 MCG/0.5ML IM SUSP
INTRAMUSCULAR | 0 refills | Status: AC
  Filled 2020-06-19: qty 0.25, 1d supply, fill #0

## 2020-06-19 NOTE — Progress Notes (Signed)
   Covid-19 Vaccination Clinic  Name:  Lisa Skinner    MRN: [id number] DOB: [birth year]  06/19/2020  [name] was observed post Covid-19 immunization for 15 minutes without incident. She was provided with Vaccine Information Sheet and instruction to access the V-Safe system.   [name] was instructed to call 911 with any severe reactions post vaccine: Marland Kitchen Difficulty breathing  . Swelling of face and throat  . A fast heartbeat  . A bad rash all over body  . Dizziness and weakness   Immunizations Administered    Name Date Dose VIS Date Route   Moderna Covid-19 Booster Vaccine 06/19/2020  9:07 AM 0.25 mL 12/25/2019 Intramuscular   Manufacturer: Moderna   Lot: [id number]   [place]: [id number]

## 2021-02-17 ENCOUNTER — Ambulatory Visit: Payer: BC Managed Care – PPO | Admitting: Dermatology

## 2021-02-17 ENCOUNTER — Encounter: Payer: Self-pay | Admitting: Dermatology

## 2021-02-17 ENCOUNTER — Other Ambulatory Visit: Payer: Self-pay

## 2021-02-17 DIAGNOSIS — D1801 Hemangioma of skin and subcutaneous tissue: Secondary | ICD-10-CM

## 2021-02-17 DIAGNOSIS — L578 Other skin changes due to chronic exposure to nonionizing radiation: Secondary | ICD-10-CM | POA: Diagnosis not present

## 2021-02-17 DIAGNOSIS — L821 Other seborrheic keratosis: Secondary | ICD-10-CM

## 2021-02-17 DIAGNOSIS — Z1283 Encounter for screening for malignant neoplasm of skin: Secondary | ICD-10-CM

## 2021-02-17 DIAGNOSIS — I781 Nevus, non-neoplastic: Secondary | ICD-10-CM | POA: Diagnosis not present

## 2021-02-17 DIAGNOSIS — D485 Neoplasm of uncertain behavior of skin: Secondary | ICD-10-CM

## 2021-02-17 DIAGNOSIS — L988 Other specified disorders of the skin and subcutaneous tissue: Secondary | ICD-10-CM | POA: Diagnosis not present

## 2021-02-17 DIAGNOSIS — D229 Melanocytic nevi, unspecified: Secondary | ICD-10-CM

## 2021-02-17 DIAGNOSIS — L814 Other melanin hyperpigmentation: Secondary | ICD-10-CM

## 2021-02-17 NOTE — Patient Instructions (Addendum)
Recommend taking Heliocare sun protection supplement daily in sunny weather for additional sun protection. For maximum protection on the sunniest days, you can take up to 2 capsules of regular Heliocare OR take 1 capsule of Heliocare Ultra. For prolonged exposure (such as a full day in the sun), you can repeat your dose of the supplement 4 hours after your first dose. Heliocare can be purchased at [facility] or at [email or web address].    Will prescribe [facility] Anti-Aging Tretinoin 0.025%/Niacinamide/Vitamin C/Vitamin E/Turmeric/Resveratrol with Hyaluronic Acid. Apply pea sized amount nightly to the entire face.  The patient was advised this is not covered by insurance since it is made by a compounding pharmacy. They will receive an email to check out and the medication will be mailed to their home.   Topical retinoid medications like tretinoin can cause dryness and irritation when first started. Only apply a pea-sized amount to the entire affected area. Avoid applying it around the eyes, edges of mouth and creases at the nose. If you experience irritation, use a good moisturizer first and/or apply the medicine less often. If you are doing well with the medicine, you can increase how often you use it until you are applying every night. Be careful with sun protection while using this medication as it can make you sensitive to the sun. This medicine should not be used by pregnant women.   Instructions for [facility] Medications  One or more of your medications was sent to the [facility] mail order compounding pharmacy. You will receive an email from them and can purchase the medicine through that link. It will then be mailed to your home at the address you confirmed. If for any reason you do not receive an email from them, please check your spam folder. If you still do not find the email, please let us know. [facility] phone number is [phone].   Melanoma ABCDEs  Melanoma  is the most dangerous type of skin cancer, and is the leading cause of death from skin disease.  You are more likely to develop melanoma if you: Have light-colored skin, light-colored eyes, or red or blond hair Spend a lot of time in the sun Tan regularly, either outdoors or in a tanning bed Have had blistering sunburns, especially during childhood Have a close family member who has had a melanoma Have atypical moles or large birthmarks  Early detection of melanoma is key since treatment is typically straightforward and cure rates are extremely high if we catch it early.   The first sign of melanoma is often a change in a mole or a new dark spot.  The ABCDE system is a way of remembering the signs of melanoma.  A for asymmetry:  The two halves do not match. B for border:  The edges of the growth are irregular. C for color:  A mixture of colors are present instead of an even brown color. D for diameter:  Melanomas are usually (but not always) greater than 77mm - the size of a pencil eraser. E for evolution:  The spot keeps changing in size, shape, and color.  Please check your skin once per month between visits. You can use a small mirror in front and a large mirror behind you to keep an eye on the back side or your body.   If you see any new or changing lesions before your next follow-up, please call to schedule a visit.  Please continue daily skin protection including broad spectrum sunscreen  SPF 30+ to sun-exposed areas, reapplying every 2 hours as needed when you're outdoors.    If You Need Anything After Your Visit  If you have any questions or concerns for your doctor, please call our main line at [phone] and press option 4 to reach your doctor's medical assistant. If no one answers, please leave a voicemail as directed and we will return your call as soon as possible. Messages left after 4 pm will be answered the following business day.   You may also send [place] a message via  [place]. We typically respond to MyChart messages within 1-2 business days.  For prescription refills, please ask your pharmacy to contact our office. Our fax number is [phone].  If you have an urgent issue when the clinic is closed that cannot wait until the next business day, you can page your doctor at the number below.    Please note that while we do our best to be available for urgent issues outside of office hours, we are not available 24/7.   If you have an urgent issue and are unable to reach [place], you may choose to seek medical care at your doctor's office, retail clinic, urgent care center, or emergency room.  If you have a medical emergency, please immediately call 911 or go to the emergency department.  Pager Numbers  - Dr. Nehemiah Massed: [phone]  - Dr. Laurence Ferrari: [phone]  - Dr. Nicole Kindred: [phone]  In the event of inclement weather, please call our main line at [phone] for an update on the status of any delays or closures.  Dermatology Medication Tips: Please keep the boxes that topical medications come in in order to help keep track of the instructions about where and how to use these. Pharmacies typically print the medication instructions only on the boxes and not directly on the medication tubes.   If your medication is too expensive, please contact our office at [phone] option 4 or send [place] a message through [place].   We are unable to tell what your co-pay for medications will be in advance as this is different depending on your insurance coverage. However, we may be able to find a substitute medication at lower cost or fill out paperwork to get insurance to cover a needed medication.   If a prior authorization is required to get your medication covered by your insurance company, please allow [place] 1-2 business days to complete this process.  Drug prices often vary depending on where the prescription is filled and some pharmacies may offer cheaper  prices.  The website www.goodrx.com contains coupons for medications through different pharmacies. The prices here do not account for what the cost may be with help from insurance (it may be cheaper with your insurance), but the website can give you the price if you did not use any insurance.  - You can print the associated coupon and take it with your prescription to the pharmacy.  - You may also stop by our office during regular business hours and pick up a GoodRx coupon card.  - If you need your prescription sent electronically to a different pharmacy, notify our office through [facility] or by phone at [phone] option 4.     Si Usted Necesita Algo Despus de Su Visita  Tambin puede enviarnos un mensaje a travs de Pharmacist, community. Por lo general respondemos a los mensajes de MyChart en el transcurso de 1 a 2 das hbiles.  Para renovar recetas, por favor pida a su farmacia  que se ponga en contacto con nuestra oficina. Harland Dingwall de fax es [place] [phone].  Si tiene un asunto urgente cuando la clnica est cerrada y que no puede esperar hasta el siguiente da hbil, puede llamar/localizar a su doctor(a) al nmero que aparece a continuacin.   Por favor, tenga en cuenta que aunque hacemos todo lo posible para estar disponibles para asuntos urgentes fuera del horario de [place], no estamos disponibles las 24 horas del da, los 7 das de la [place].   Si tiene un problema urgente y no puede comunicarse con nosotros, puede optar por buscar atencin mdica  en el consultorio de su doctor(a), en una clnica privada, en un centro de atencin urgente o en una sala de emergencias.  Si tiene Engineering geologist, por favor llame inmediatamente al 911 o vaya a la sala de emergencias.  Nmeros de bper  - Dr. Nehemiah Massed: [phone]  - Dra. Moye: [phone]  - Dra. Nicole Kindred: [phone]  En caso de inclemencias del [place], por favor llame a Johnsie Kindred principal al [phone]  para una actualizacin sobre el [place] de cualquier retraso o cierre.  Consejos para la medicacin en dermatologa: Por favor, guarde las cajas en las que vienen los medicamentos de uso tpico para ayudarle a seguir las instrucciones sobre dnde y cmo usarlos. Las farmacias generalmente imprimen las instrucciones del medicamento slo en las cajas y no directamente en los tubos del [place].   Si su medicamento es muy caro, por favor, pngase en contacto con Zigmund Daniel llamando al [phone] y presione la opcin 4 o envenos un mensaje a travs de Pharmacist, community.   No podemos decirle cul ser su copago por los medicamentos por adelantado ya que esto es diferente dependiendo de la cobertura de su seguro. Sin embargo, es posible que podamos encontrar un medicamento sustituto a Electrical engineer un formulario para que el seguro cubra el medicamento que se considera necesario.   Si se requiere una autorizacin previa para que su compaa de seguros [place] su medicamento, por favor permtanos de 1 a 2 das hbiles para completar este proceso.  Los precios de los medicamentos varan con frecuencia dependiendo del Environmental consultant de dnde se surte la receta y alguna farmacias pueden ofrecer precios ms baratos.  El sitio web www.goodrx.com tiene cupones para medicamentos de Airline pilot. Los precios aqu no tienen en cuenta lo que podra costar con la ayuda del seguro (puede ser ms barato con su seguro), pero el sitio web puede darle el precio si no utiliz Research scientist (physical sciences).  - Puede imprimir el cupn correspondiente y llevarlo con su receta a la farmacia.  - Tambin puede pasar por nuestra oficina durante el horario de atencin regular y Charity fundraiser una tarjeta de cupones de GoodRx.  - Si necesita que su receta se enve electrnicamente a una farmacia diferente, informe a nuestra oficina a travs de MyChart de [place] o por telfono llamando al [phone] y presione la opcin 4.

## 2021-02-17 NOTE — Progress Notes (Signed)
Follow-Up Visit   Subjective  Lisa Skinner is a 60 y.o. female who presents for the following: Annual Exam (Patient here for full body skin exam and skin cancer screening. Patient does not have a hx of skin cancer. She does have some discoloration at chest she would like to ask about but no new or changing spots she is concerned about.).  No fhx skin cancer.   The following portions of the chart were reviewed this encounter and updated as appropriate:   Tobacco   Allergies   Meds   Problems   Med Hx   Surg Hx   Fam Hx       Review of Systems:  No other skin or systemic complaints except as noted in HPI or Assessment and Plan.  Objective  Well appearing patient in no apparent distress; mood and affect are within normal limits.  A full examination was performed including scalp, head, eyes, ears, nose, lips, neck, chest, axillae, abdomen, back, buttocks, bilateral upper extremities, bilateral lower extremities, hands, feet, fingers, toes, fingernails, and toenails. All findings within normal limits unless otherwise noted below.  Left Upper Arm 0.5 cm pink papule without features suspicious for malignancy on dermoscopy        face Rhytides and volume loss.    Assessment & Plan  Neoplasm of uncertain behavior of skin Left Upper Arm  Favor lichenoid keratosis  Recheck on follow up in 6-8 weeks Call for changes  Elastosis of skin face  With lentigines and some thin Sk's at face, chest, hands  Will prescribe [facility] Anti-Aging Tretinoin 0.025%/Niacinamide/Vitamin C/Vitamin E/Turmeric/Resveratrol with Hyaluronic Acid. Apply pea sized amount nightly to the entire face.  The patient was advised this is not covered by insurance since it is made by a compounding pharmacy. They will receive an email to check out and the medication will be mailed to their home.   Topical retinoid medications like tretinoin can cause dryness and irritation when first started. Only apply a  pea-sized amount to the entire affected area. Avoid applying it around the eyes, edges of mouth and creases at the nose. If you experience irritation, use a good moisturizer first and/or apply the medicine less often. If you are doing well with the medicine, you can increase how often you use it until you are applying every night. Be careful with sun protection while using this medication as it can make you sensitive to the sun. This medicine should not be used by pregnant women.    Lentigines - Scattered tan macules - Due to sun exposure - Benign-appearing, observe - Recommend daily broad spectrum sunscreen SPF 30+ to sun-exposed areas, reapply every 2 hours as needed. - Call for any changes  Seborrheic Keratoses - Stuck-on, waxy, tan-brown papules and/or plaques  - Benign-appearing - Discussed benign etiology and prognosis. - Observe - Call for any changes  Melanocytic Nevi - Tan-brown and/or pink-flesh-colored symmetric macules and papules - Benign appearing on exam today - Observation - Call clinic for new or changing moles - Recommend daily use of broad spectrum spf 30+ sunscreen to sun-exposed areas.   Hemangiomas - Red papules - Discussed benign nature - Observe - Call for any changes  Actinic Damage - Chronic condition, secondary to cumulative UV/sun exposure - diffuse scaly erythematous macules with underlying dyspigmentation - Recommend daily broad spectrum sunscreen SPF 30+ to sun-exposed areas, reapply every 2 hours as needed.  - Staying in the shade or wearing long sleeves, sun glasses (UVA+UVB protection)  and wide brim hats (4-inch brim around the entire circumference of the hat) are also recommended for sun protection.  - Call for new or changing lesions.  Skin cancer screening performed today.  Varicose Veins/Spider Veins - Dilated blue, purple or red veins at the lower extremities - Reassured - Smaller vessels can be treated by sclerotherapy (a procedure to  inject a medicine into the veins to make them disappear) if desired, but the treatment is not covered by insurance. Larger vessels may be covered if symptomatic and we would refer to vascular surgeon if treatment desired. - Will refer to vein and vascular to be evaluated for venous reflux  Return for as scheduled to recheck left upper arm.  Graciella Belton, RMA, am acting as scribe for Forest Gleason, MD .  Documentation: I have reviewed the above documentation for accuracy and completeness, and I agree with the above.  Forest Gleason, MD

## 2021-02-18 ENCOUNTER — Other Ambulatory Visit: Payer: Self-pay

## 2021-02-18 DIAGNOSIS — I781 Nevus, non-neoplastic: Secondary | ICD-10-CM

## 2021-03-05 ENCOUNTER — Other Ambulatory Visit: Payer: Self-pay

## 2021-03-05 ENCOUNTER — Ambulatory Visit (INDEPENDENT_AMBULATORY_CARE_PROVIDER_SITE_OTHER): Payer: BC Managed Care – PPO | Admitting: Nurse Practitioner

## 2021-03-05 ENCOUNTER — Encounter (INDEPENDENT_AMBULATORY_CARE_PROVIDER_SITE_OTHER): Payer: Self-pay | Admitting: Nurse Practitioner

## 2021-03-05 VITALS — BP 137/83 | HR 62 | Resp 16 | Ht 64.0 in | Wt 171.4 lb

## 2021-03-05 DIAGNOSIS — I83813 Varicose veins of bilateral lower extremities with pain: Secondary | ICD-10-CM

## 2021-03-06 ENCOUNTER — Encounter (INDEPENDENT_AMBULATORY_CARE_PROVIDER_SITE_OTHER): Payer: Self-pay | Admitting: Nurse Practitioner

## 2021-03-06 NOTE — Progress Notes (Signed)
Subjective:    Patient ID: Lisa Skinner, female    DOB: 1960-12-01, 60 y.o.   MRN: [id number] Chief Complaint  Patient presents with   New Patient (Initial Visit)    Ref [facility] consult spider veins    Lisa Skinner is a 60 year old female that is seen for evaluation of symptomatic varicose veins. The patient relates burning and stinging which worsened steadily throughout the course of the day, particularly with standing. The patient also notes an aching and throbbing pain over the varicosities, particularly with prolonged dependent positions. The symptoms are significantly improved with elevation.  The patient also notes that during hot weather the symptoms are greatly intensified. The patient states the pain from the varicose veins interferes with work, daily exercise, shopping and household maintenance. At this point, the symptoms are persistent and severe enough that they're having a negative impact on lifestyle and are interfering with daily activities.  There is no history of DVT, PE or superficial thrombophlebitis. There is no history of ulceration or hemorrhage. The patient denies a significant family history of varicose veins.   The patient has not worn graduated compression in the past. At the present time the patient has not been using over-the-counter analgesics. There is no history of prior surgical intervention or sclerotherapy.      Review of Systems  Cardiovascular:  Positive for leg swelling.  All other systems reviewed and are negative.     Objective:   Physical Exam Vitals reviewed.  HENT:     Head: Normocephalic.  Cardiovascular:     Rate and Rhythm: Normal rate.     Pulses: Normal pulses.  Pulmonary:     Effort: Pulmonary effort is normal.  Skin:    General: Skin is warm and dry.     Comments: Multiple spider varicosities   Neurological:     Mental Status: She is alert and oriented to person, place, and time.  Psychiatric:        Mood and Affect: Mood  normal.        Behavior: Behavior normal.        Thought Content: Thought content normal.        Judgment: Judgment normal.    BP 137/83 (BP Location: Left Arm)    Pulse 62    Resp 16    Ht 5\' 4"  (1.626 m)    Wt 171 lb 6.4 oz (77.7 kg)    BMI 29.42 kg/m   Past Medical History:  Diagnosis Date   Allergic rhinitis    Ankle sprain 08/18/2016   right   Arthritis    KNEE   GERD (gastroesophageal reflux disease)    OCC   History of mammogram 09/15/2015   neg   History of mumps    History of shingles    Hypothyroidism    Sinus infection 09/12/2019   Thyroid disease    hypothyroid    Social History   Socioeconomic History   Marital status: Married    Spouse name: Lisa Skinner   Number of children: 0   Years of education: Not on file   Highest education level: Not on file  Occupational History   Occupation: Scientist, water quality at [employer] Use   Smoking status: Never   Smokeless tobacco: Never  Vaping Use   Vaping Use: Never used  Substance and Sexual Activity   Alcohol use: No    Alcohol/week: 0.0 standard drinks   Drug use: No   Sexual activity: Yes  Partners: Male    Birth control/protection: Surgical  Other Topics Concern   Not on file  Social History Narrative   Not on file   Social Determinants of Health   Financial Resource Strain: Not on file  Food Insecurity: Not on file  Transportation Needs: Not on file  Physical Activity: Not on file  Stress: Not on file  Social Connections: Not on file  Intimate Partner Violence: Not on file    Past Surgical History:  Procedure Laterality Date   ABDOMINAL HYSTERECTOMY  2003   with BSO multiple cyst; BVD   DILATION AND CURETTAGE OF UTERUS  07/17/1992   GANGLION CYST EXCISION Left 09/18/2019   Procedure: excision of left wrist ganglion, excision of cyst from left thumb;  Surgeon: Dereck Leep, MD;  Location: [facility] ORS;  Service: Orthopedics;  Laterality: Left;   HEMI-MICRODISCECTOMY LUMBAR LAMINECTOMY LEVEL 1  Bilateral 01/14/2019   Procedure: L4-5 HEMILAMINECTOMY & DISCECTOMY;  Surgeon: Deetta Perla, MD;  Location: [facility] ORS;  Service: Neurosurgery;  Laterality: Bilateral;   KNEE ARTHROSCOPY  90S   LEFT   LASIK  05/20/1994   OOPHORECTOMY      Family History  Problem Relation Age of Onset   Cancer Mother 31        UTERINE   Hypertension Mother    COPD Mother    Congestive Heart Failure Mother    Alcohol abuse Father    Hypertension Father    Congestive Heart Failure Father    Pneumonia Father        died from aspiration pneumonia   Melanoma Sister 31       recurrence 2013    Allergies  Allergen Reactions   Codeine Other (See Comments)    Abdominal pain; BP spike; dizzy   Augmentin [Amoxicillin-Pot Clavulanate] Other (See Comments)    (GI upset) / pt can tolerate plain amoxicillin Did it involve swelling of the face/tongue/throat, SOB, or low BP? No Did it involve sudden or severe rash/hives, skin peeling, or any reaction on the inside of your mouth or nose? No Did you need to seek medical attention at a hospital or doctor's office? No When did it last happen?       If all above answers are "NO", may proceed with cephalosporin use.    Cephalexin Other (See Comments)    GI Upset.    CBC Latest Ref Rng & Units 01/10/2019 10/28/2013  WBC 4.0 - 10.5 K/uL 7.6 5.7  Hemoglobin 12.0 - 15.0 g/dL 15.6(H) 15.1  Hematocrit 36.0 - 46.0 % 47.0(H) 42  Platelets 150 - 400 K/uL 239 224      CMP     Component Value Date/Time   NA 139 01/10/2019 1103   NA 141 11/21/2018 0822   K 4.1 01/10/2019 1103   CL 102 01/10/2019 1103   CO2 27 01/10/2019 1103   GLUCOSE 86 01/10/2019 1103   BUN 18 01/10/2019 1103   BUN 16 11/21/2018 0822   CREATININE 0.85 01/10/2019 1103   CALCIUM 9.4 01/10/2019 1103   PROT 6.7 11/21/2018 0822   ALBUMIN 4.1 11/21/2018 0822   AST 24 11/21/2018 0822   ALT 16 11/21/2018 0822   ALKPHOS 50 11/21/2018 0822   BILITOT 1.4 (H) 11/21/2018 0822   GFRNONAA >60  01/10/2019 1103   GFRAA >60 01/10/2019 1103     No results found.     Assessment & Plan:   1. Varicose veins of both lower extremities with pain  Recommend:  The patient  has large symptomatic varicose veins that are painful and associated with swelling.  I have had a long discussion with the patient regarding  varicose veins and why they cause symptoms.  Patient will begin wearing graduated compression stockings class 1 on a daily basis, beginning first thing in the morning and removing them in the evening. The patient is instructed specifically not to sleep in the stockings.    The patient  will also begin using over-the-counter analgesics such as Motrin 600 mg po TID to help control the symptoms.    In addition, behavioral modification including elevation during the day will be continued.    Pending the results of these changes the  patient will be reevaluated in three months.   An  ultrasound of the venous system will be obtained.   Further plans will be based on the ultrasound results and whether conservative therapies are successful at eliminating the pain and swelling.     Current Outpatient Medications on File Prior to Visit  Medication Sig Dispense Refill   acetaminophen (TYLENOL) 650 MG CR tablet Take 1,300 mg by mouth every 8 (eight) hours as needed for pain.     Carboxymethylcell-Hypromellose (GENTEAL OP) Place 1 drop into both eyes 2 (two) times daily.      COVID-19 mRNA vaccine, Moderna, (MODERNA COVID-19 VACCINE) 100 MCG/0.5ML injection Inject into the muscle. 0.25 mL 0   estradiol (VIVELLE-DOT) 0.05 MG/24HR patch APPLY 1 PATCH(0.05 MG) EXTERNALLY TO THE SKIN 2 TIMES A WEEK 8 patch 0   etodolac (LODINE) 400 MG tablet Take by mouth.     fexofenadine (ALLEGRA) 180 MG tablet Take 180 mg by mouth every morning.      fluticasone (FLONASE) 50 MCG/ACT nasal spray instill 2 sprays into each nostril once daily (Patient taking differently: Place 2 sprays into both nostrils 2  (two) times daily.) 16 g 6   levothyroxine (SYNTHROID) 88 MCG tablet TAKE 1 TABLET(88 MCG) BY MOUTH DAILY (Patient taking differently: Take 88 mcg by mouth daily before breakfast.) 90 tablet 3   Multiple Vitamins-Minerals (MULTIVITAMIN ADULTS 50+) TABS Take 0.5 tablets by mouth in the morning and at bedtime.      OVER THE COUNTER MEDICATION Take 1 tablet by mouth 2 (two) times daily with a meal. Tear support plus otc supplement     Probiotic CAPS Take 1 capsule by mouth daily.     No current facility-administered medications on file prior to visit.    There are no Patient Instructions on file for this visit. No follow-ups on file.   Kris Hartmann, NP

## 2021-04-23 ENCOUNTER — Other Ambulatory Visit: Payer: Self-pay | Admitting: Obstetrics and Gynecology

## 2021-04-23 DIAGNOSIS — Z1231 Encounter for screening mammogram for malignant neoplasm of breast: Secondary | ICD-10-CM

## 2021-05-13 ENCOUNTER — Ambulatory Visit: Payer: BC Managed Care – PPO | Admitting: Dermatology

## 2021-05-28 ENCOUNTER — Ambulatory Visit (INDEPENDENT_AMBULATORY_CARE_PROVIDER_SITE_OTHER): Payer: BC Managed Care – PPO | Admitting: Nurse Practitioner

## 2021-05-28 ENCOUNTER — Encounter (INDEPENDENT_AMBULATORY_CARE_PROVIDER_SITE_OTHER): Payer: BC Managed Care – PPO

## 2021-06-08 ENCOUNTER — Ambulatory Visit
Admission: RE | Admit: 2021-06-08 | Discharge: 2021-06-08 | Disposition: A | Discharge status: Home / Self Care | Payer: BC Managed Care – PPO | Source: Ambulatory Visit | Attending: Obstetrics and Gynecology | Admitting: Obstetrics and Gynecology

## 2021-06-08 DIAGNOSIS — Z1231 Encounter for screening mammogram for malignant neoplasm of breast: Secondary | ICD-10-CM | POA: Diagnosis present

## 2021-09-24 IMAGING — US US BREAST*R* LIMITED INC AXILLA
1 series · 3 of 3 positions shown · non-contrast
Comparison: Previous exam(s).

ACR Breast Density Category a: The breast tissue is almost entirely
fatty.

CLINICAL DATA: 58-year-old presenting with possible palpable lumps
in the far UPPER RIGHT breast near the 12 o'clock location and in
the UPPER OUTER QUADRANT of the LEFT breast/axillary tail.Patient
states that she has lost approximately 30 pound since her prior
mammogram in September 2018.

EXAM:
DIGITAL DIAGNOSTIC BILATERAL MAMMOGRAM WITH CAD AND TOMO
LIMITED ULTRASOUND BILATERAL BREASTS

[Series 1: us breast*right* limited inc axilla · 0.07mm/px · 3 of 3 slices shown]
[im 1/3]
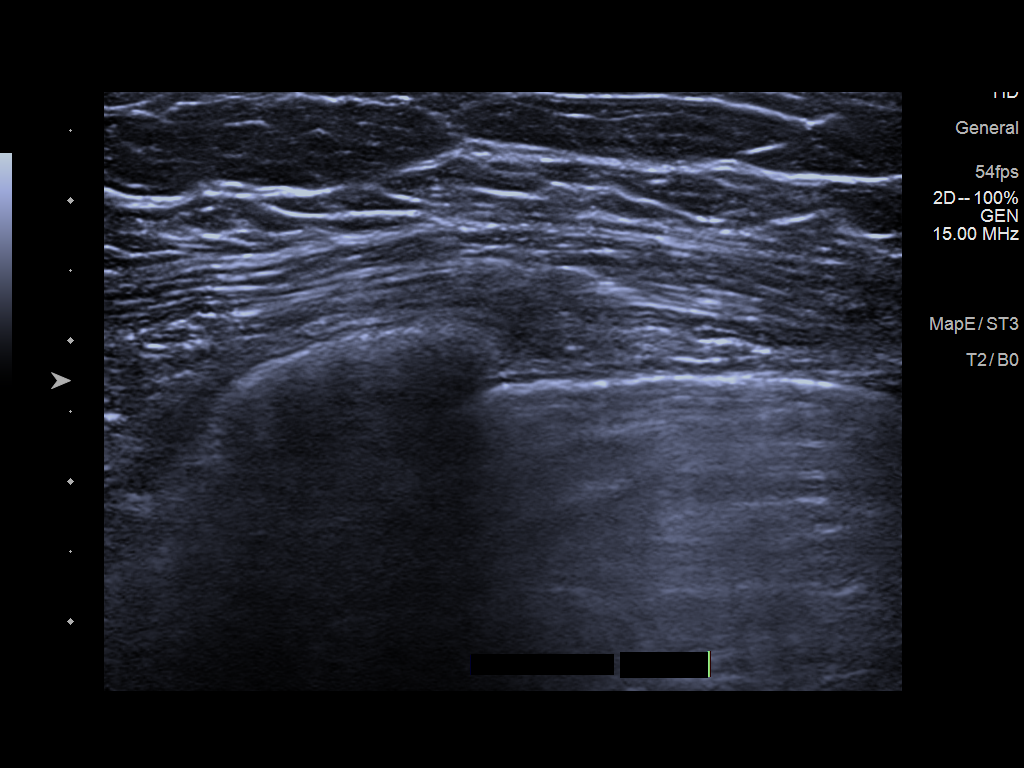
[im 2/3]
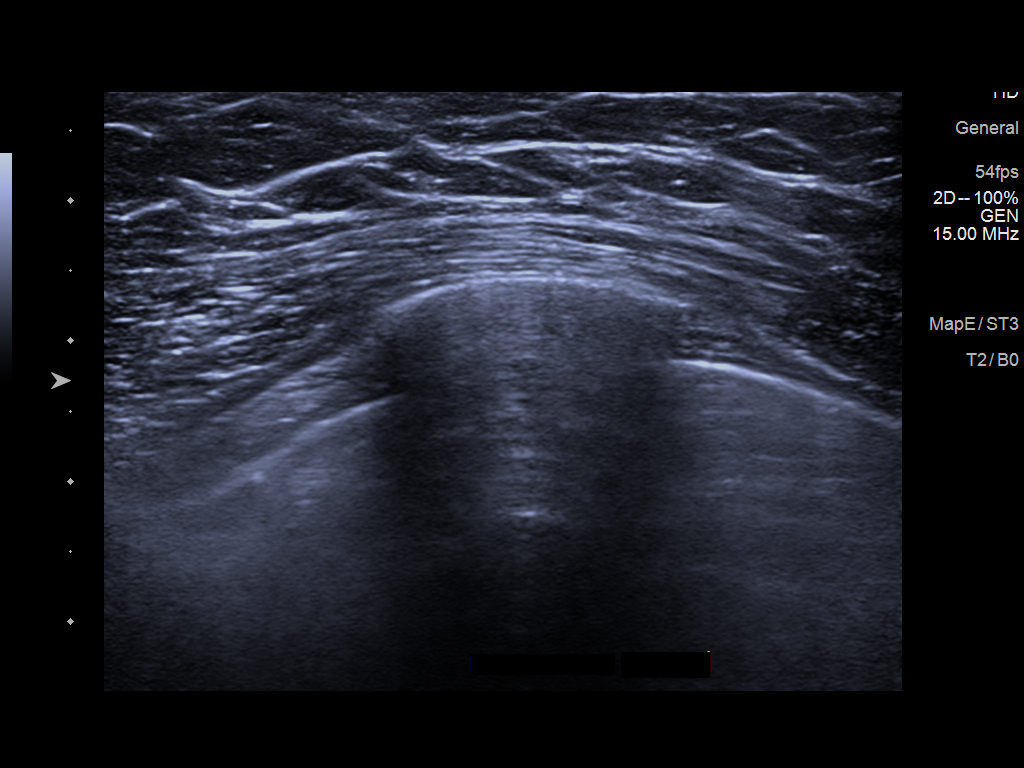
[im 3/3]
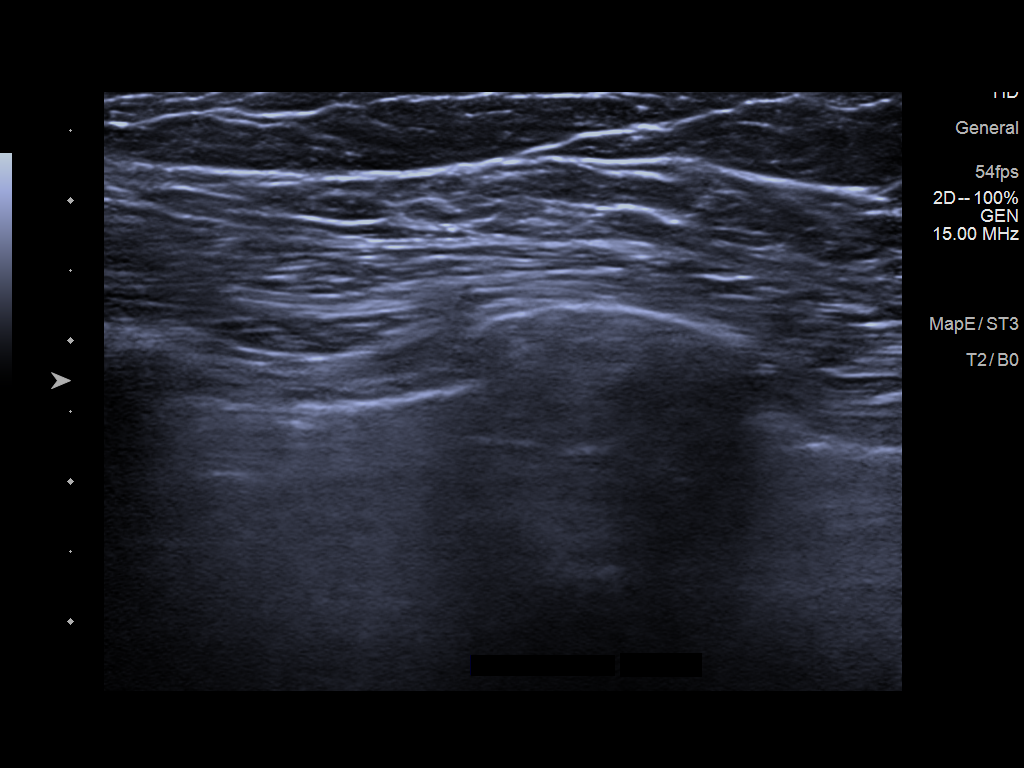

[3 of 3 positions shown; findings below may reference images not displayed]

FINDINGS: Tomosynthesis and synthesized full field CC and MLO views of both
breasts were obtained. Tomosynthesis and synthesized spot
compression tangential view of the areas of concern in both breasts
were also obtained.

RIGHT: No mammographic abnormality in the area of palpable concern
in the UPPER RIGHT breast at far POSTERIOR depth.

No findings suspicious for malignancy in the RIGHT breast.

Mammographic images were processed with CAD.

On correlative physical exam, the tissues of the UPPER RIGHT breast
have a "lumpy bumpy" texture in the area of palpable concern, though
I do not palpate a discrete mass.

Targeted RIGHT breast ultrasound is performed, showing normal
fibrofatty tissue at the 12 o'clock position approximately 8 cm from
the nipple in the area of palpable concern. An ANTERIOR RIGHT rib
underlies the area of palpable concern. No cyst, solid mass or
abnormal acoustic shadowing is identified.

LEFT: No mammographic abnormality in the area of palpable concern in
the UPPER OUTER QUADRANT at POSTERIOR depth/axillary tail. A normal
appearing low LEFT axillary lymph node is present in the vicinity of
the palpable concern, though this node has been stable over multiple
prior mammograms.

No findings suspicious for malignancy in the LEFT breast.

Mammographic images were processed with CAD.

On correlative physical exam, the tissues of the axillary tail of
the LEFT breast in the area palpable concern have a "lumpy bumpy"
texture, though I do not palpate a discrete mass.
IMPRESSION: No mammographic or sonographic evidence of malignancy involving
either breast.

RECOMMENDATION:
Screening mammogram in one year.(Code:[id number]-SIU)

I have discussed the findings and recommendations with the patient.
If applicable, a reminder letter will be sent to the patient
regarding the next appointment.

BI-RADS CATEGORY  1: Negative.

## 2021-09-24 IMAGING — MG DIGITAL DIAGNOSTIC BILAT W/ TOMO W/ CAD
6 of 12 series · 6 of 36 positions shown · non-contrast
Comparison: Previous exam(s).

ACR Breast Density Category a: The breast tissue is almost entirely
fatty.

CLINICAL DATA: 58-year-old presenting with possible palpable lumps
in the far UPPER RIGHT breast near the 12 o'clock location and in
the UPPER OUTER QUADRANT of the LEFT breast/axillary tail.Patient
states that she has lost approximately 30 pound since her prior
mammogram in September 2018.

EXAM:
DIGITAL DIAGNOSTIC BILATERAL MAMMOGRAM WITH CAD AND TOMO
LIMITED ULTRASOUND BILATERAL BREASTS

[L TAN synth-2D]
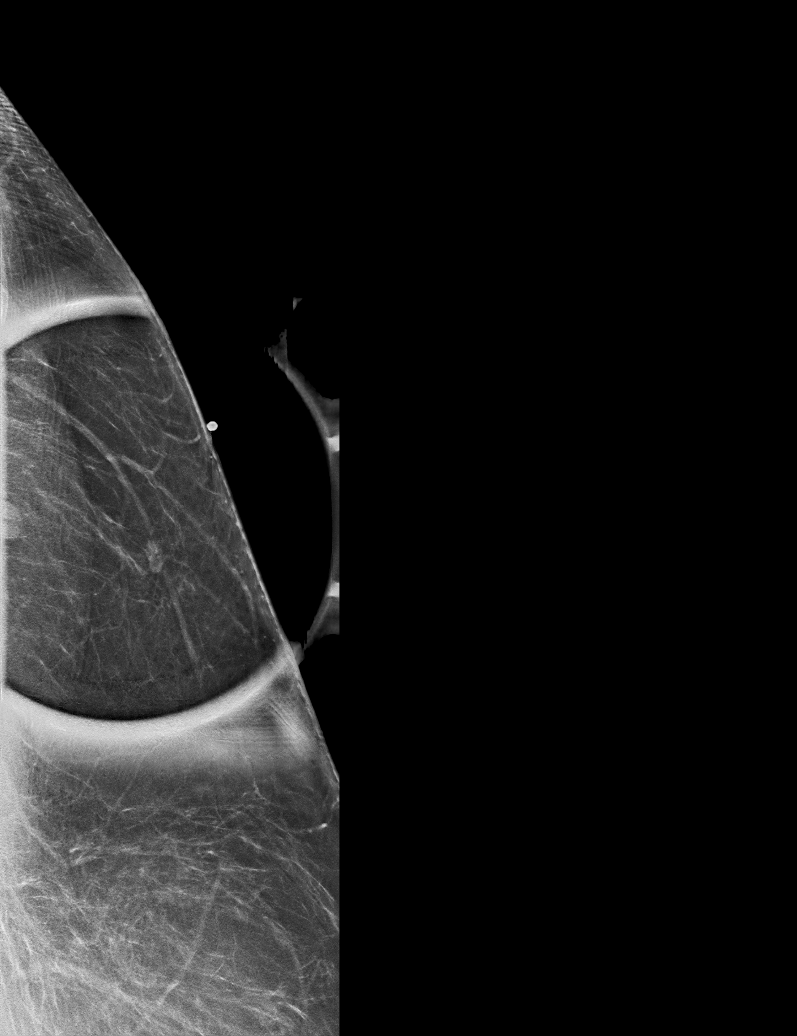

[L MLO synth-2D]
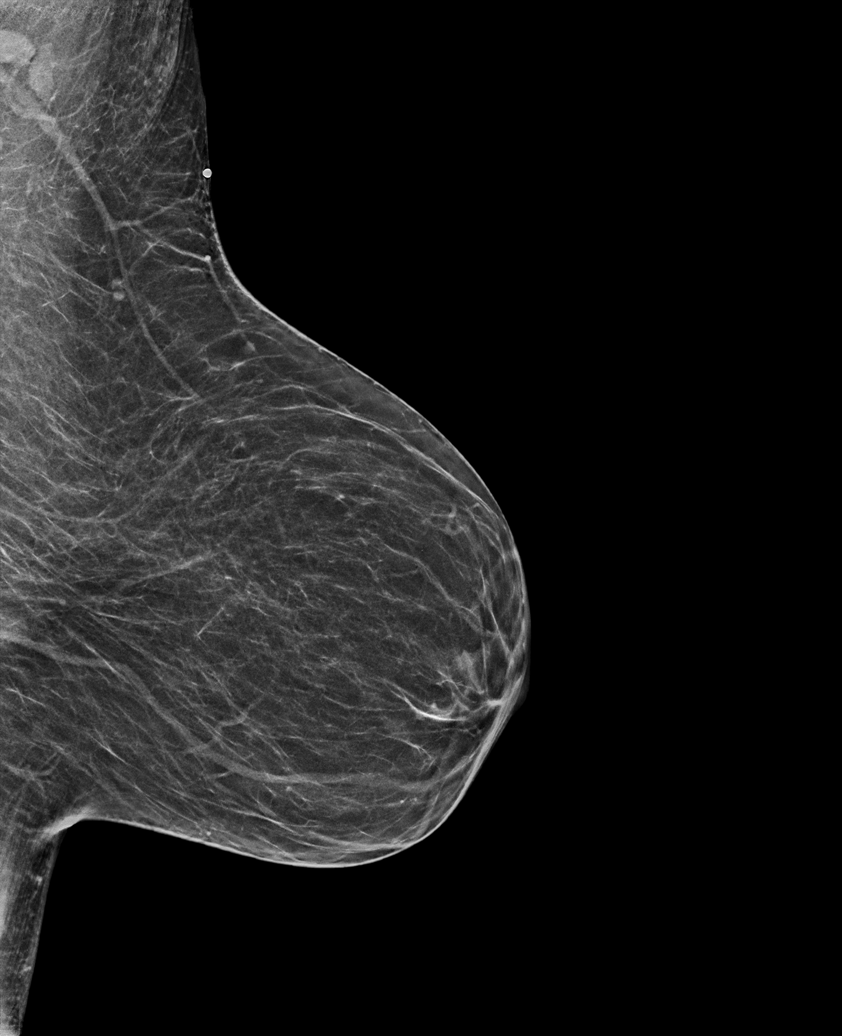

[R MLO synth-2D]
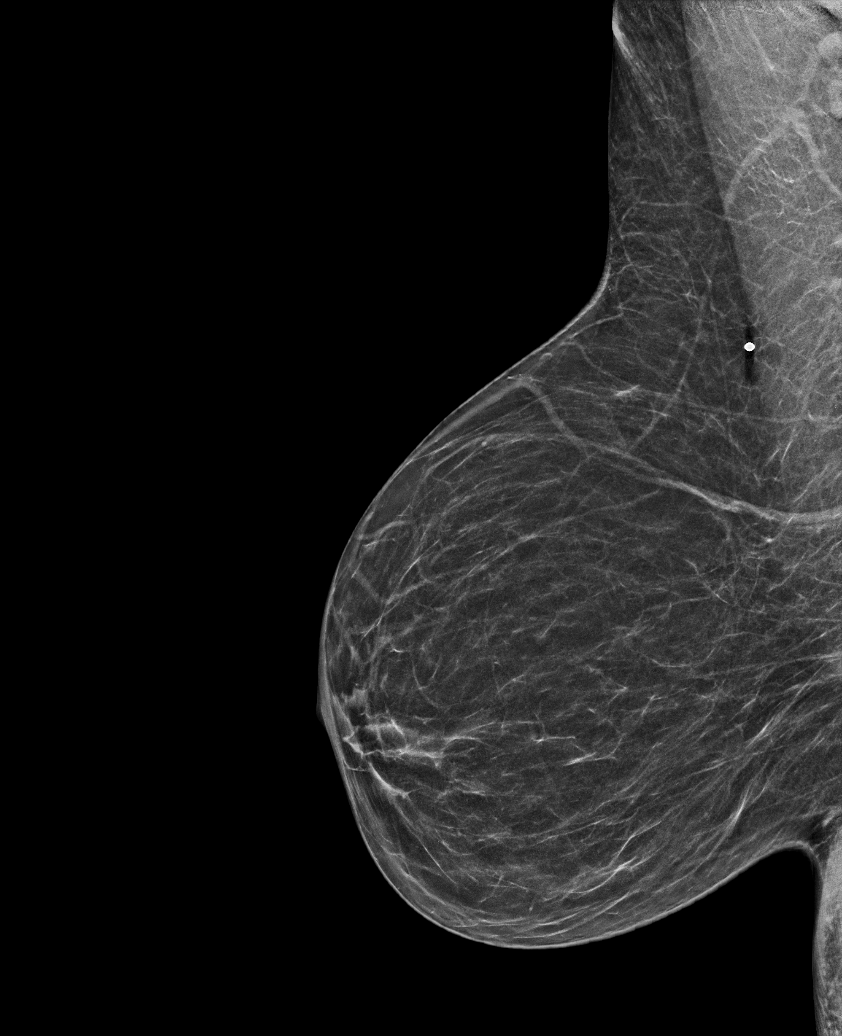

[L CC synth-2D]
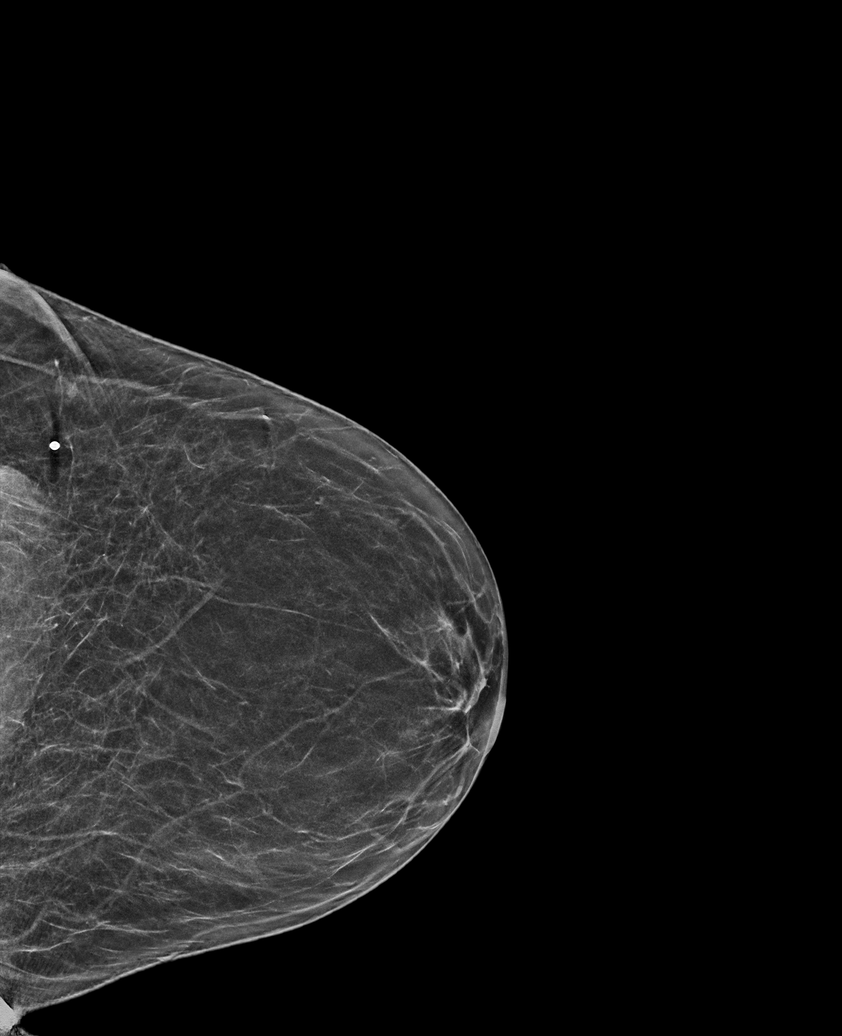

[R TAN synth-2D]
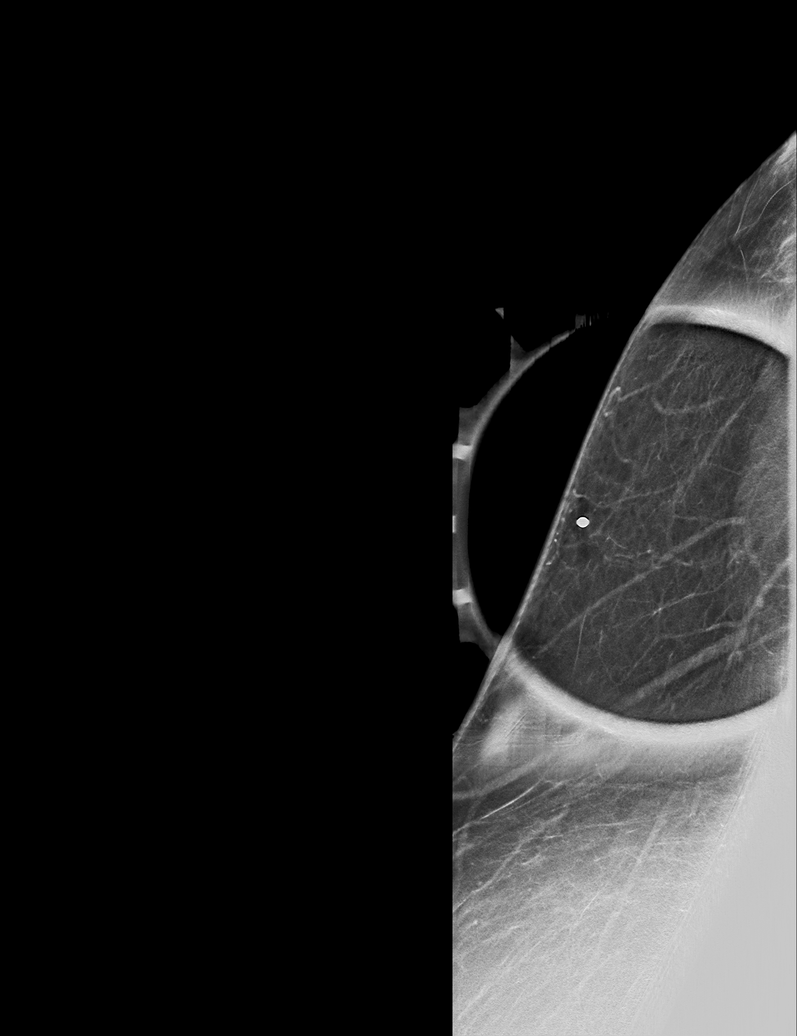

[R CC synth-2D]
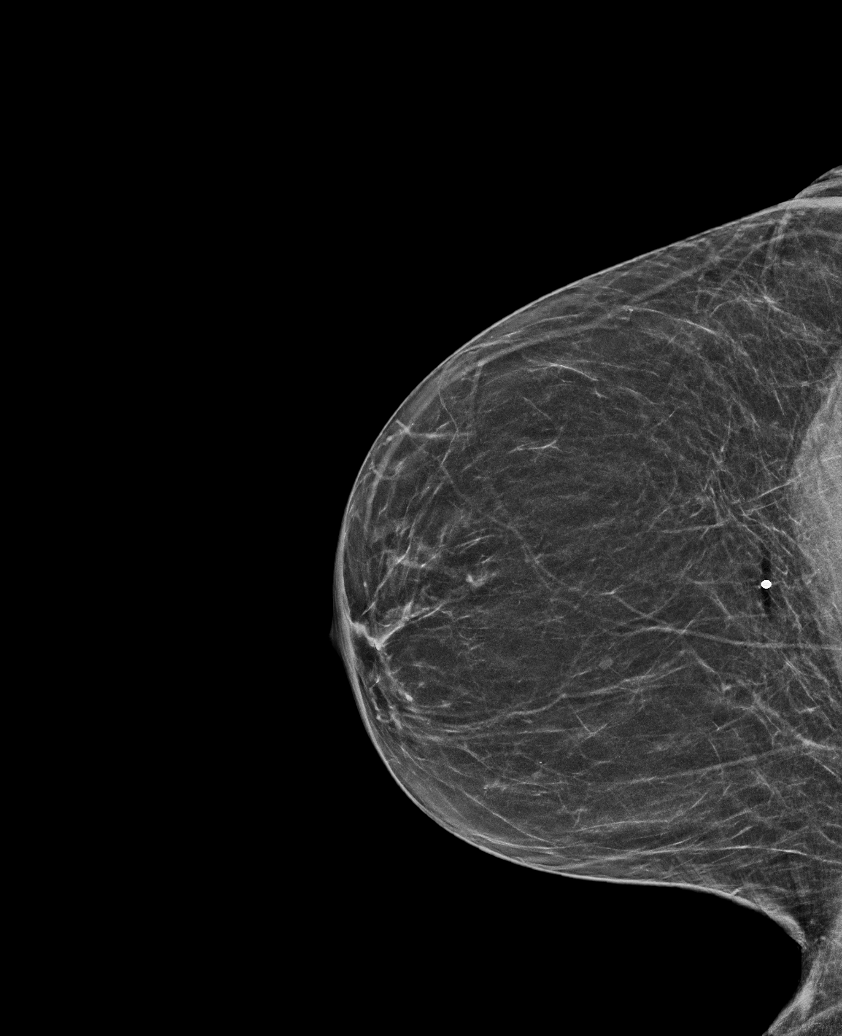

[6 of 36 positions shown; findings below may reference images not displayed]

FINDINGS: Tomosynthesis and synthesized full field CC and MLO views of both
breasts were obtained. Tomosynthesis and synthesized spot
compression tangential view of the areas of concern in both breasts
were also obtained.

RIGHT: No mammographic abnormality in the area of palpable concern
in the UPPER RIGHT breast at far POSTERIOR depth.

No findings suspicious for malignancy in the RIGHT breast.

Mammographic images were processed with CAD.

On correlative physical exam, the tissues of the UPPER RIGHT breast
have a "lumpy bumpy" texture in the area of palpable concern, though
I do not palpate a discrete mass.

Targeted RIGHT breast ultrasound is performed, showing normal
fibrofatty tissue at the 12 o'clock position approximately 8 cm from
the nipple in the area of palpable concern. An ANTERIOR RIGHT rib
underlies the area of palpable concern. No cyst, solid mass or
abnormal acoustic shadowing is identified.

LEFT: No mammographic abnormality in the area of palpable concern in
the UPPER OUTER QUADRANT at POSTERIOR depth/axillary tail. A normal
appearing low LEFT axillary lymph node is present in the vicinity of
the palpable concern, though this node has been stable over multiple
prior mammograms.

No findings suspicious for malignancy in the LEFT breast.

Mammographic images were processed with CAD.

On correlative physical exam, the tissues of the axillary tail of
the LEFT breast in the area palpable concern have a "lumpy bumpy"
texture, though I do not palpate a discrete mass.
IMPRESSION: No mammographic or sonographic evidence of malignancy involving
either breast.

RECOMMENDATION:
Screening mammogram in one year.(Code:[id number]-[id number])

I have discussed the findings and recommendations with the patient.
If applicable, a reminder letter will be sent to the patient
regarding the next appointment.

BI-RADS CATEGORY  1: Negative.

## 2022-01-03 ENCOUNTER — Encounter (INDEPENDENT_AMBULATORY_CARE_PROVIDER_SITE_OTHER): Payer: Self-pay

## 2022-02-23 ENCOUNTER — Encounter: Payer: BC Managed Care – PPO | Admitting: Dermatology

## 2022-04-25 ENCOUNTER — Other Ambulatory Visit: Payer: Self-pay | Admitting: Obstetrics and Gynecology

## 2022-04-25 DIAGNOSIS — Z1231 Encounter for screening mammogram for malignant neoplasm of breast: Secondary | ICD-10-CM

## 2022-06-10 ENCOUNTER — Ambulatory Visit
Admission: RE | Admit: 2022-06-10 | Discharge: 2022-06-10 | Disposition: A | Discharge status: Home / Self Care | Payer: BC Managed Care – PPO | Source: Ambulatory Visit | Attending: Obstetrics and Gynecology | Admitting: Obstetrics and Gynecology

## 2022-06-10 DIAGNOSIS — Z1231 Encounter for screening mammogram for malignant neoplasm of breast: Secondary | ICD-10-CM | POA: Insufficient documentation

## 2023-05-02 ENCOUNTER — Other Ambulatory Visit: Payer: Self-pay | Admitting: Internal Medicine

## 2023-05-02 DIAGNOSIS — Z1231 Encounter for screening mammogram for malignant neoplasm of breast: Secondary | ICD-10-CM

## 2023-06-13 ENCOUNTER — Ambulatory Visit
Admission: RE | Admit: 2023-06-13 | Discharge: 2023-06-13 | Disposition: A | Discharge status: Home / Self Care | Payer: Self-pay | Source: Ambulatory Visit | Attending: Internal Medicine | Admitting: Internal Medicine

## 2023-06-13 DIAGNOSIS — Z1231 Encounter for screening mammogram for malignant neoplasm of breast: Secondary | ICD-10-CM | POA: Insufficient documentation

## 2024-03-12 ENCOUNTER — Other Ambulatory Visit: Payer: Self-pay | Admitting: Internal Medicine

## 2024-03-12 DIAGNOSIS — Z1231 Encounter for screening mammogram for malignant neoplasm of breast: Secondary | ICD-10-CM

## 2024-06-14 ENCOUNTER — Encounter
